# Patient Record
Sex: Female | Born: 1970
Health system: Southern US, Community
[De-identification: ages and names within clinical notes are randomized; demographics above are authoritative.]

## PROBLEM LIST (undated history)

## (undated) DIAGNOSIS — M25579 Pain in unspecified ankle and joints of unspecified foot: Secondary | ICD-10-CM

## (undated) DIAGNOSIS — F419 Anxiety disorder, unspecified: Secondary | ICD-10-CM

## (undated) DIAGNOSIS — G629 Polyneuropathy, unspecified: Secondary | ICD-10-CM

## (undated) HISTORY — DX: Polyneuropathy, unspecified: G62.9

## (undated) HISTORY — PX: ANKLE SURGERY: SHX546

## (undated) HISTORY — PX: FOOT SURGERY: SHX648

## (undated) HISTORY — DX: Pain in unspecified ankle and joints of unspecified foot: M25.579

## (undated) HISTORY — DX: Anxiety disorder, unspecified: F41.9

## (undated) HISTORY — PX: ABDOMINAL HYSTERECTOMY: SHX81

## (undated) HISTORY — PX: LEG SURGERY: SHX1003

## (undated) HISTORY — PX: TOTAL ABDOMINAL HYSTERECTOMY: SHX209

## (undated) HISTORY — PX: GALLBLADDER SURGERY: SHX652

---

## 2002-02-24 ENCOUNTER — Ambulatory Visit (HOSPITAL_COMMUNITY): Admission: RE | Admit: 2002-02-24 | Discharge: 2002-02-24 | Payer: Self-pay | Admitting: *Deleted

## 2002-02-25 ENCOUNTER — Encounter (HOSPITAL_COMMUNITY): Admission: RE | Admit: 2002-02-25 | Discharge: 2002-02-25 | Payer: Self-pay | Admitting: *Deleted

## 2002-05-12 ENCOUNTER — Inpatient Hospital Stay (HOSPITAL_COMMUNITY): Admission: RE | Admit: 2002-05-12 | Discharge: 2002-05-13 | Payer: Self-pay | Admitting: Gynecology

## 2011-04-24 DIAGNOSIS — M25579 Pain in unspecified ankle and joints of unspecified foot: Secondary | ICD-10-CM

## 2011-04-24 HISTORY — DX: Pain in unspecified ankle and joints of unspecified foot: M25.579

## 2013-09-11 ENCOUNTER — Ambulatory Visit: Payer: Self-pay | Admitting: Family Medicine

## 2013-09-11 DIAGNOSIS — Z0289 Encounter for other administrative examinations: Secondary | ICD-10-CM

## 2015-08-04 DIAGNOSIS — G629 Polyneuropathy, unspecified: Secondary | ICD-10-CM

## 2015-08-04 DIAGNOSIS — R110 Nausea: Secondary | ICD-10-CM

## 2015-08-04 DIAGNOSIS — F419 Anxiety disorder, unspecified: Secondary | ICD-10-CM

## 2015-08-04 HISTORY — DX: Anxiety disorder, unspecified: F41.9

## 2015-08-04 HISTORY — DX: Nausea: R11.0

## 2015-08-04 HISTORY — DX: Polyneuropathy, unspecified: G62.9

## 2015-12-02 ENCOUNTER — Encounter: Payer: Self-pay | Admitting: Osteopathic Medicine

## 2015-12-02 ENCOUNTER — Ambulatory Visit (INDEPENDENT_AMBULATORY_CARE_PROVIDER_SITE_OTHER): Payer: Self-pay | Admitting: Osteopathic Medicine

## 2015-12-02 VITALS — BP 135/83 | HR 98 | Temp 98.4°F | Ht 67.0 in | Wt 156.0 lb

## 2015-12-02 DIAGNOSIS — M791 Myalgia, unspecified site: Secondary | ICD-10-CM | POA: Insufficient documentation

## 2015-12-02 DIAGNOSIS — F1320 Sedative, hypnotic or anxiolytic dependence, uncomplicated: Secondary | ICD-10-CM | POA: Insufficient documentation

## 2015-12-02 DIAGNOSIS — Z79899 Other long term (current) drug therapy: Secondary | ICD-10-CM

## 2015-12-02 DIAGNOSIS — F411 Generalized anxiety disorder: Secondary | ICD-10-CM

## 2015-12-02 DIAGNOSIS — R110 Nausea: Secondary | ICD-10-CM

## 2015-12-02 HISTORY — DX: Generalized anxiety disorder: F41.1

## 2015-12-02 HISTORY — DX: Myalgia, unspecified site: M79.10

## 2015-12-02 HISTORY — DX: Sedative, hypnotic or anxiolytic dependence, uncomplicated: F13.20

## 2015-12-02 LAB — CBC WITH DIFFERENTIAL/PLATELET
BASOS PCT: 0 %
Basophils Absolute: 0 cells/uL (ref 0–200)
Eosinophils Absolute: 192 cells/uL (ref 15–500)
Eosinophils Relative: 3 %
HEMATOCRIT: 46.6 % — AB (ref 35.0–45.0)
HEMOGLOBIN: 15.8 g/dL — AB (ref 11.7–15.5)
LYMPHS ABS: 1408 {cells}/uL (ref 850–3900)
Lymphocytes Relative: 22 %
MCH: 34.3 pg — ABNORMAL HIGH (ref 27.0–33.0)
MCHC: 33.9 g/dL (ref 32.0–36.0)
MCV: 101.3 fL — ABNORMAL HIGH (ref 80.0–100.0)
MONO ABS: 640 {cells}/uL (ref 200–950)
MPV: 9.2 fL (ref 7.5–12.5)
Monocytes Relative: 10 %
Neutro Abs: 4160 cells/uL (ref 1500–7800)
Neutrophils Relative %: 65 %
Platelets: 270 10*3/uL (ref 140–400)
RBC: 4.6 MIL/uL (ref 3.80–5.10)
RDW: 12 % (ref 11.0–15.0)
WBC: 6.4 10*3/uL (ref 3.8–10.8)

## 2015-12-02 LAB — COMPLETE METABOLIC PANEL WITH GFR
ALBUMIN: 4.3 g/dL (ref 3.6–5.1)
ALK PHOS: 62 U/L (ref 33–115)
ALT: 56 U/L — AB (ref 6–29)
AST: 62 U/L — AB (ref 10–30)
BUN: 5 mg/dL — AB (ref 7–25)
CALCIUM: 9.3 mg/dL (ref 8.6–10.2)
CHLORIDE: 102 mmol/L (ref 98–110)
CO2: 26 mmol/L (ref 20–31)
CREATININE: 0.52 mg/dL (ref 0.50–1.10)
GFR, Est African American: >89 mL/min (ref >=60)
GFR, Est Non African American: >89 mL/min (ref >=60)
Glucose, Bld: 81 mg/dL (ref 65–99)
Potassium: 4.3 mmol/L (ref 3.5–5.3)
Sodium: 138 mmol/L (ref 135–146)
Total Bilirubin: 0.5 mg/dL (ref 0.2–1.2)
Total Protein: 7.4 g/dL (ref 6.1–8.1)

## 2015-12-02 LAB — LIPID PANEL
CHOLESTEROL: 183 mg/dL (ref 125–200)
HDL: 98 mg/dL (ref >=46)
LDL Cholesterol: 69 mg/dL (ref <130)
Total CHOL/HDL Ratio: 1.9 Ratio (ref <=5.0)
Triglycerides: 79 mg/dL (ref <150)
VLDL: 16 mg/dL (ref <30)

## 2015-12-02 LAB — TSH: TSH: 2.56 m[IU]/L

## 2015-12-02 MED ORDER — DIAZEPAM 5 MG PO TABS: ORAL_TABLET | ORAL | Status: DC

## 2015-12-02 MED ORDER — PROMETHAZINE HCL 25 MG PO TABS: 25.0000 mg | ORAL_TABLET | Freq: Two times a day (BID) | ORAL | Status: DC | PRN

## 2015-12-02 MED ORDER — AMITRIPTYLINE HCL 25 MG PO TABS: 25.0000 mg | ORAL_TABLET | Freq: Every day | ORAL | Status: DC

## 2015-12-02 MED ORDER — ESCITALOPRAM OXALATE 5 MG PO TABS: 5.0000 mg | ORAL_TABLET | Freq: Every day | ORAL | Status: DC

## 2015-12-02 MED ORDER — GABAPENTIN 300 MG PO CAPS: 300.0000 mg | ORAL_CAPSULE | Freq: Every day | ORAL | Status: DC

## 2015-12-02 NOTE — Patient Instructions (Signed)
PLAN TO FOLLOW UP HERE IN 2 WEEKS! SOONER IF NEEDED. WE ARE WORKING TO GET YOU SEEN BY PSYCHIATRY - YOU SHOULD HEAR ABOUT THIS REFERRAL SOON, PLEASE CALL us IF YOU [place] HEARD BACK BY NEXT Monday OR Tuesday.   ANY CONCERNS FOR WITHDRAWAL, ANY SEVERE ANXIETY OR DEPRESSIVE SYMPTOMS, PARTICULARLY THOUGHTS OF HURTING YOURSELF OR OTHERS, PLEASE SEEK CARE IN AN EMERGENCY ROOM RIGHT AWAY!

## 2015-12-02 NOTE — Progress Notes (Signed)
HPI: Deborah Zhang is a 45 y.o. female who presents to [place] today for chief complaint of:  Chief Complaint  Patient presents with  . Establish Care  . Medication Refill    clonazopam     BENZODIAZEPINE - Klonopin 0.5mg  qid, was previous Rx by Dr Murlean Iba. Has been taking it at this dose , was on 1 mg bid but then pharmacy a few years ago switched formularies and she had "reaction" to it - reaction was anxiety, no hives/anaphylaxis reaction. Previous medications include Hydroxyzine for a few years, prior to that in early 20s was on Xanax but she didn't like this. Was previously tried daily medications, not sure names of those meds, no adverse reaction to those medicines. History of hospitalization (stayed in ER) for withdrawal after quitting cold [place], this was at [facility]. She was back on qid dosing but has been trying to wean herself off and currently at tid/bid dosing.   ORTHOPEDICS - R leg and ankle, multiple surgeries, last seen by Ortho few years ago. Patient taking gabapentin and amitriptyline for chronic muscle spasm.  CHRONIC NAUSEA - Phenergan bid for several years, unknown previous workup.    Past medical, social and family history reviewed: No past medical history on file. Past Surgical History  Procedure Laterality Date  . Abdominal hysterectomy      for Cervical Cancer   . Gallbladder surgery    . Foot surgery    . Ankle surgery    . Leg surgery     Social History  Substance Use Topics  . Smoking status: Not on file  . Smokeless tobacco: Not on file  . Alcohol Use: Not on file   No family history on file.  No current outpatient prescriptions on file.   No current facility-administered medications for this visit.   No Known Allergies    Review of Systems: CONSTITUTIONAL:  No  fever, no chills, No recent illness, No unintentional weight changes HEAD/EYES/EARS/NOSE/THROAT: No  headache, no vision change, no hearing  change, No sore throat, No  sinus pressure CARDIAC: No  chest pain, No  pressure, No palpitations, No  orthopnea RESPIRATORY: No  cough, No  shortness of breath/wheeze GASTROINTESTINAL: No  nausea, No  vomiting, No  abdominal pain, No  blood in stool, No  diarrhea, No  constipation  MUSCULOSKELETAL: (+) myalgia/arthralgia GENITOURINARY: No  incontinence, No  abnormal genital bleeding/discharge SKIN: No  rash/wounds/concerning lesions HEM/ONC: No  easy bruising/bleeding, No  abnormal lymph node ENDOCRINE: No polyuria/polydipsia/polyphagia, No  heat/cold intolerance  NEUROLOGIC: No  weakness, No  dizziness, No  slurred speech PSYCHIATRIC: No  concerns with depression, (+) concerns with anxiety, No sleep problems  Exam:  BP 135/83 mmHg  Pulse 98  Temp(Src) 98.4 F (36.9 C)  Ht 5\' 7"  (1.702 m)  Wt 156 lb (70.761 kg)  BMI 24.43 kg/m2  SpO2 97%  LMP  Constitutional: VS see above. General Appearance: alert, well-developed, well-nourished, NAD Eyes: Normal lids and conjunctive, non-icteric sclera, PERRLA Ears, Nose, Mouth, Throat: MMM, Normal external inspection ears/nares/mouth/lips/gums, TM normal bilaterally. Pharynx/tonsils no erythema, no exudate. Nasal mucosa normal.  Neck: No masses, trachea midline. No thyroid enlargement. No tenderness/mass appreciated. No lymphadenopathy Respiratory: Normal respiratory effort. no wheeze, no rhonchi, no rales Cardiovascular: S1/S2 normal, no murmur, no rub/gallop auscultated. RRR.  No carotid bruit or JVD. No abdominal aortic bruit.  Pedal pulse II/IV bilaterally DP and PT.  No lower extremity edema. Gastrointestinal: Nontender, no masses. No hepatomegaly,  no splenomegaly. No hernia appreciated. Bowel sounds normal. Rectal exam deferred.  Musculoskeletal: Gait normal. No clubbing/cyanosis of digits.  Neurological: No cranial nerve deficit on limited exam. Motor and sensation intact and symmetric Skin: warm, dry, intact. No rash/ulcer. No concerning  nevi or subq nodules on limited exam.   Psychiatric: Normal judgment/insight. Normal mood and affect. Oriented x3.    No results found for this or any previous visit (from the past 72 hour(s)).  No results found.      ASSESSMENT/PLAN:   Benzo equivalents calculated. Clonazepam 0.5mg  = Valium 10 mg, went off tid dosing for taper = Valium equivalents at 30 mg/day so start taper at 50% = 15 mg/day and will slowly wean off reducing dose q 5 days.   Hopefully will be able to get her seen by psych before she is off the taper.   Can titrate up on Lexapro if tolerating low dose after 5 - 7 days - pt to call ad let me know how she is doing on this medicine.   Patient has been educated on significant possible side effects of medication and is instructed to contact me or other medical professional with any concerns about side effects.   Generalized anxiety disorder - Initiate exercise therapy while we taper down on benzodiazepines. Urgent referral to psychiatry.  - Plan: escitalopram (LEXAPRO) 5 MG tablet, Ambulatory referral to Psychiatry, TSH, diazepam (VALIUM) 5 MG tablet, DISCONTINUED: diazepam (VALIUM) 5 MG tablet  Benzodiazepine dependence ([place]) - Patient advised that do not prescribe long-term daily use of benzos for anxiety control, she is looking to get off of these medicines, taper initiated today  Chronic nausea - Plan: promethazine (PHENERGAN) 25 MG tablet  Muscle pain - Consider changing dose of gabapentin and could increase amitriptyline, we'll discuss this further at next visit - Plan: amitriptyline (ELAVIL) 25 MG tablet, gabapentin (NEURONTIN) 300 MG capsule  Medication management - Plan: CBC with Differential/Platelet, COMPLETE METABOLIC PANEL WITH GFR, Lipid panel, TSH  All questions were answered. Visit summary with medication list and pertinent instructions was printed for patient to review. ER/RTC precautions were reviewed with the patient. Return in about 2 weeks (around  12/16/2015) for FOLLOW-UP MEDICATION MANAGEMENT (OV30) .   Total time spent 45 minutes, greater than 50% of the visit was face-to-face counseling and coordinating care for diagnosis of anxiety and benzo dependence.

## 2015-12-05 ENCOUNTER — Telehealth: Payer: Self-pay

## 2015-12-05 DIAGNOSIS — F411 Generalized anxiety disorder: Secondary | ICD-10-CM

## 2015-12-05 MED ORDER — ESCITALOPRAM OXALATE 10 MG PO TABS: 10.0000 mg | ORAL_TABLET | Freq: Every day | ORAL | Status: DC

## 2015-12-05 NOTE — Telephone Encounter (Signed)
If she is not experiencing any side effects from the Lexapro, I would not expect it to make a huge difference in her mood after just the first week or so, but we can certainly go up on the dose to 10 mg. Can double up on the medicines she has and I have called in 10 mg pills to her pharmacy. She should also have received a call about setting up an appointment with psychiatry, please make sure that she has heard back about this.

## 2015-12-05 NOTE — Telephone Encounter (Signed)
Patient called stated that Lexapro 5 mg is not working for her. She request a higher dose. Please advise. Tonjia Parillo,CMA

## 2015-12-05 NOTE — Telephone Encounter (Signed)
Called patient to give her information but was unable to leave a message. Branson Kranz,CMA

## 2015-12-16 ENCOUNTER — Ambulatory Visit: Payer: Self-pay | Admitting: Osteopathic Medicine

## 2016-01-03 ENCOUNTER — Other Ambulatory Visit: Payer: Self-pay | Admitting: Osteopathic Medicine

## 2016-05-30 ENCOUNTER — Other Ambulatory Visit: Payer: Self-pay | Admitting: Osteopathic Medicine

## 2016-05-30 DIAGNOSIS — R110 Nausea: Secondary | ICD-10-CM

## 2016-06-21 DIAGNOSIS — K921 Melena: Secondary | ICD-10-CM | POA: Diagnosis not present

## 2016-06-21 DIAGNOSIS — F17210 Nicotine dependence, cigarettes, uncomplicated: Secondary | ICD-10-CM | POA: Diagnosis not present

## 2016-06-21 DIAGNOSIS — Z7982 Long term (current) use of aspirin: Secondary | ICD-10-CM | POA: Diagnosis not present

## 2016-06-21 DIAGNOSIS — R1032 Left lower quadrant pain: Secondary | ICD-10-CM | POA: Diagnosis not present

## 2016-06-21 DIAGNOSIS — G629 Polyneuropathy, unspecified: Secondary | ICD-10-CM | POA: Diagnosis not present

## 2016-06-21 DIAGNOSIS — R110 Nausea: Secondary | ICD-10-CM | POA: Diagnosis not present

## 2016-06-21 DIAGNOSIS — Z79899 Other long term (current) drug therapy: Secondary | ICD-10-CM | POA: Diagnosis not present

## 2016-06-21 DIAGNOSIS — Z720 Tobacco use: Secondary | ICD-10-CM | POA: Diagnosis not present

## 2016-06-21 DIAGNOSIS — R1030 Lower abdominal pain, unspecified: Secondary | ICD-10-CM | POA: Diagnosis not present

## 2016-06-21 DIAGNOSIS — F419 Anxiety disorder, unspecified: Secondary | ICD-10-CM | POA: Diagnosis not present

## 2016-06-26 DIAGNOSIS — K921 Melena: Secondary | ICD-10-CM | POA: Diagnosis not present

## 2016-06-27 ENCOUNTER — Other Ambulatory Visit: Payer: Self-pay | Admitting: Osteopathic Medicine

## 2016-06-27 DIAGNOSIS — R110 Nausea: Secondary | ICD-10-CM

## 2016-06-28 ENCOUNTER — Other Ambulatory Visit: Payer: Self-pay | Admitting: *Deleted

## 2016-06-28 DIAGNOSIS — R110 Nausea: Secondary | ICD-10-CM

## 2016-06-28 MED ORDER — PROMETHAZINE HCL 25 MG PO TABS: 25.0000 mg | ORAL_TABLET | Freq: Two times a day (BID) | ORAL | 0 refills | Status: DC | PRN

## 2016-06-29 ENCOUNTER — Other Ambulatory Visit: Payer: Self-pay | Admitting: Osteopathic Medicine

## 2016-06-29 DIAGNOSIS — M791 Myalgia, unspecified site: Secondary | ICD-10-CM

## 2016-06-29 DIAGNOSIS — R197 Diarrhea, unspecified: Secondary | ICD-10-CM | POA: Diagnosis not present

## 2016-06-29 DIAGNOSIS — K921 Melena: Secondary | ICD-10-CM | POA: Diagnosis not present

## 2016-06-29 DIAGNOSIS — K648 Other hemorrhoids: Secondary | ICD-10-CM | POA: Diagnosis not present

## 2016-06-29 DIAGNOSIS — D123 Benign neoplasm of transverse colon: Secondary | ICD-10-CM | POA: Diagnosis not present

## 2016-07-08 ENCOUNTER — Encounter: Payer: Self-pay | Admitting: Osteopathic Medicine

## 2016-07-08 DIAGNOSIS — Z860100 Personal history of colon polyps, unspecified: Secondary | ICD-10-CM

## 2016-07-08 HISTORY — DX: Personal history of colon polyps, unspecified: Z86.0100

## 2016-07-08 HISTORY — DX: Personal history of colonic polyps: Z86.010

## 2016-07-18 ENCOUNTER — Ambulatory Visit (INDEPENDENT_AMBULATORY_CARE_PROVIDER_SITE_OTHER): Payer: BLUE CROSS/BLUE SHIELD | Admitting: Osteopathic Medicine

## 2016-07-18 ENCOUNTER — Encounter: Payer: Self-pay | Admitting: Osteopathic Medicine

## 2016-07-18 VITALS — BP 127/86 | HR 92 | Ht 67.0 in | Wt 148.0 lb

## 2016-07-18 DIAGNOSIS — M791 Myalgia, unspecified site: Secondary | ICD-10-CM

## 2016-07-18 DIAGNOSIS — R030 Elevated blood-pressure reading, without diagnosis of hypertension: Secondary | ICD-10-CM | POA: Diagnosis not present

## 2016-07-18 DIAGNOSIS — Z23 Encounter for immunization: Secondary | ICD-10-CM

## 2016-07-18 DIAGNOSIS — R110 Nausea: Secondary | ICD-10-CM

## 2016-07-18 DIAGNOSIS — F411 Generalized anxiety disorder: Secondary | ICD-10-CM

## 2016-07-18 DIAGNOSIS — G629 Polyneuropathy, unspecified: Secondary | ICD-10-CM

## 2016-07-18 MED ORDER — PROMETHAZINE HCL 25 MG PO TABS: 25.0000 mg | ORAL_TABLET | Freq: Two times a day (BID) | ORAL | 3 refills | Status: DC | PRN

## 2016-07-18 MED ORDER — AMITRIPTYLINE HCL 25 MG PO TABS: 25.0000 mg | ORAL_TABLET | Freq: Every day | ORAL | 3 refills | Status: DC

## 2016-07-18 MED ORDER — GABAPENTIN 300 MG PO CAPS: 300.0000 mg | ORAL_CAPSULE | ORAL | 11 refills | Status: DC

## 2016-07-18 NOTE — Progress Notes (Signed)
HPI: Deborah Zhang is a 46 y.o. female  who presents to [name] today, 07/18/16,  for chief complaint of:  Chief Complaint  Patient presents with  . Follow-up    BLOOD PRESSURE    Anxiety: last saw patient several months ago. At that point, benzodiazepine dependence which we were trying to bring her off of with a slow taper of Valium. I did not seen her since but this regimen was successful for her. Overall, her anxiety is well controlled on low-dose amitriptyline  Chronic pain: Taking gabapentin multiple doses per day. Amitriptyline still at low dose.  Chronic nausea: Stable on current medications  Patient is accompanied by daughter who assists with history-taking.   Past medical, surgical, social and family history reviewed: Patient Active Problem List   Diagnosis Date Noted  . History of colonic polyps 07/08/2016  . Benzodiazepine dependence ([place]) 12/02/2015  . Generalized anxiety disorder 12/02/2015  . Muscle pain 12/02/2015  . Anxiety 08/04/2015  . Chronic nausea 08/04/2015  . Neuropathy ([place]) 08/04/2015  . Ankle pain 04/24/2011   Past Surgical History:  Procedure Laterality Date  . ABDOMINAL HYSTERECTOMY     for Cervical Cancer   . ANKLE SURGERY    . FOOT SURGERY    . GALLBLADDER SURGERY    . LEG SURGERY     Social History  Substance Use Topics  . Smoking status: Current Every Day Smoker    Packs/day: 1.00    Years: 20.00    Types: Cigarettes  . Smokeless tobacco: Never Used  . Alcohol use 0.6 oz/week    1 Standard drinks or equivalent per week   Family History  Problem Relation Age of Onset  . Depression Father   . Depression Mother   . Hyperlipidemia Mother   . Hypertension Mother      Current medication list and allergy/intolerance information reviewed:   Current Outpatient Prescriptions  Medication Sig Dispense Refill  . amitriptyline (ELAVIL) 25 MG tablet Take 1 tablet (25 mg total) by mouth at bedtime.  NEED FOLLOW UP APPOINTMENT FOR MORE REFILLS 30 tablet 0  . gabapentin (NEURONTIN) 300 MG capsule Take 1 capsule (300 mg total) by mouth daily. Take one capsule by mouth up to 5 times daily. Do not exceed 5 capsules daily 150 capsule 6  . promethazine (PHENERGAN) 25 MG tablet Take 1 tablet (25 mg total) by mouth 2 (two) times daily as needed for nausea. 60 tablet 0   No current facility-administered medications for this visit.    No Known Allergies    Review of Systems:  Constitutional: No recent illness,  Cardiac: No  chest pain, No  pressure  Respiratory:  No  shortness of breath. No  Cough  Gastrointestinal: No  abdominal pain, +nausea, No  vomiting,  No  blood in stool, No  diarrhea, No  constipation   Musculoskeletal: No new myalgia/arthralgia  Skin: No  Rash,  Psychiatric: No  concerns with depression, No  concerns with anxiety, No sleep problems, No mood problems  Exam:  BP (!) 133/94   Pulse 98   Ht 5\' 7"  (1.702 m)   Wt 148 lb (67.1 kg)   BMI 23.18 kg/m   Constitutional: VS see above. General Appearance: alert, well-developed, well-nourished, NAD  Eyes: Normal lids and conjunctive, non-icteric sclera  Ears, Nose, Mouth, Throat: MMM,  Neck: No masses, trachea midline.   Respiratory: Normal respiratory effort. no wheeze, no rhonchi, no rales  Cardiovascular: S1/S2 normal, no murmur,  no rub/gallop auscultated. RRR. No lower extremity edema.   Neurological: Normal balance/coordination. No tremor.   Psychiatric: Normal judgment/insight. Normal mood and affect. Oriented x3.    GAD 7 : Generalized Anxiety Score 07/18/2016  Nervous, Anxious, on Edge 0  Control/stop worrying 0  Worry too much - different things 0  Trouble relaxing 0  Restless 0  Easily annoyed or irritable 0  Afraid - awful might happen 0  Total GAD 7 Score 0  Anxiety Difficulty Not difficult at all    Depression screen [facility] 2/9 07/18/2016  Decreased Interest 0  Down, Depressed, Hopeless 0   PHQ - 2 Score 0  Altered sleeping 0  Tired, decreased energy 0  Change in appetite 0  Feeling bad or failure about yourself  0  Trouble concentrating 0  Moving slowly or fidgety/restless 0  Suicidal thoughts 0  PHQ-9 Score 0     ASSESSMENT/PLAN:   Generalized anxiety disorder  Muscle pain - Patient was to stay on current dose of gabapentin/amitriptyline. Continue for now. - Plan: amitriptyline (ELAVIL) 25 MG tablet, gabapentin (NEURONTIN) 300 MG capsule  Chronic nausea - Refill promethazine. - Plan: promethazine (PHENERGAN) 25 MG tablet  Elevated blood pressure reading  Need for immunization against influenza - Plan: Flu Vaccine QUAD 36+ mos IM      Visit summary with medication list and pertinent instructions was printed for patient to review. All questions at time of visit were answered - patient instructed to contact office with any additional concerns. ER/RTC precautions were reviewed with the patient. Follow-up plan: Return in about 6 months (around 01/15/2017) for [place] or sooner if needed.

## 2017-06-21 ENCOUNTER — Other Ambulatory Visit: Payer: Self-pay | Admitting: Osteopathic Medicine

## 2017-06-21 DIAGNOSIS — R110 Nausea: Secondary | ICD-10-CM

## 2017-07-22 ENCOUNTER — Other Ambulatory Visit: Payer: Self-pay | Admitting: Osteopathic Medicine

## 2017-07-22 DIAGNOSIS — R110 Nausea: Secondary | ICD-10-CM

## 2017-07-22 DIAGNOSIS — M791 Myalgia, unspecified site: Secondary | ICD-10-CM

## 2017-07-22 DIAGNOSIS — G629 Polyneuropathy, unspecified: Secondary | ICD-10-CM

## 2017-07-24 ENCOUNTER — Encounter: Payer: Self-pay | Admitting: Osteopathic Medicine

## 2017-07-24 ENCOUNTER — Ambulatory Visit: Payer: BLUE CROSS/BLUE SHIELD | Admitting: Osteopathic Medicine

## 2017-07-24 VITALS — BP 145/85 | HR 101 | Temp 98.0°F | Wt 148.0 lb

## 2017-07-24 DIAGNOSIS — R110 Nausea: Secondary | ICD-10-CM

## 2017-07-24 DIAGNOSIS — J029 Acute pharyngitis, unspecified: Secondary | ICD-10-CM

## 2017-07-24 DIAGNOSIS — M791 Myalgia, unspecified site: Secondary | ICD-10-CM

## 2017-07-24 DIAGNOSIS — R69 Illness, unspecified: Secondary | ICD-10-CM | POA: Diagnosis not present

## 2017-07-24 DIAGNOSIS — R0981 Nasal congestion: Secondary | ICD-10-CM

## 2017-07-24 DIAGNOSIS — J111 Influenza due to unidentified influenza virus with other respiratory manifestations: Secondary | ICD-10-CM

## 2017-07-24 DIAGNOSIS — G629 Polyneuropathy, unspecified: Secondary | ICD-10-CM

## 2017-07-24 DIAGNOSIS — R05 Cough: Secondary | ICD-10-CM

## 2017-07-24 DIAGNOSIS — R059 Cough, unspecified: Secondary | ICD-10-CM

## 2017-07-24 LAB — POCT INFLUENZA A/B
Influenza A, POC: NEGATIVE
Influenza B, POC: NEGATIVE

## 2017-07-24 LAB — POCT RAPID STREP A (OFFICE): Rapid Strep A Screen: NEGATIVE

## 2017-07-24 MED ORDER — PROMETHAZINE HCL 25 MG PO TABS: 25.0000 mg | ORAL_TABLET | Freq: Two times a day (BID) | ORAL | 3 refills | Status: DC | PRN

## 2017-07-24 MED ORDER — GABAPENTIN 300 MG PO CAPS: ORAL_CAPSULE | ORAL | 11 refills | Status: DC

## 2017-07-24 MED ORDER — LIDOCAINE VISCOUS HCL 2 % MT SOLN
5.0000 mL | OROMUCOSAL | 1 refills | Status: DC | PRN
Start: 2017-07-24 — End: 2018-10-08

## 2017-07-24 MED ORDER — IPRATROPIUM BROMIDE 0.06 % NA SOLN: 2.0000 | Freq: Four times a day (QID) | NASAL | 1 refills | Status: DC

## 2017-07-24 MED ORDER — AMITRIPTYLINE HCL 25 MG PO TABS: 25.0000 mg | ORAL_TABLET | Freq: Every day | ORAL | 3 refills | Status: DC

## 2017-07-24 MED ORDER — PREDNISONE 20 MG PO TABS: 20.0000 mg | ORAL_TABLET | Freq: Two times a day (BID) | ORAL | 0 refills | Status: DC

## 2017-07-24 MED ORDER — BENZONATATE 200 MG PO CAPS: 200.0000 mg | ORAL_CAPSULE | Freq: Three times a day (TID) | ORAL | 0 refills | Status: DC | PRN

## 2017-07-24 NOTE — Patient Instructions (Addendum)
Viral Respiratory Infection A respiratory infection is an illness that affects part of the respiratory system, such as the lungs, nose, or throat. Most respiratory infections are caused by either viruses or bacteria. A respiratory infection that is caused by a virus is called a viral respiratory infection. Common types of viral respiratory infections include:  A cold.  The flu (influenza).  A respiratory syncytial virus (RSV) infection.  How do I know if I have a viral respiratory infection? Most viral respiratory infections cause:  A stuffy or runny nose.  Yellow or green nasal discharge.  A cough.  Sneezing.  Fatigue.  Achy muscles.  A sore throat.  Sweating or chills.  A fever.  A headache.  How are viral respiratory infections treated? If influenza is diagnosed early, it may be treated with an antiviral medicine that shortens the length of time a person has symptoms. Symptoms of viral respiratory infections may be treated with over-the-counter and prescription medicines, such as:  Expectorants. These make it easier to cough up mucus.  Decongestant nasal sprays.  Health care providers do not prescribe antibiotic medicines for viral infections. This is because antibiotics are designed to kill bacteria. They have no effect on viruses.  How do I know if I should stay home from work or school? To avoid exposing others to your respiratory infection, stay home if you have:  A fever.  A persistent cough.  A sore throat.  A runny nose.  Sneezing.  Muscles aches.  Headaches.  Fatigue.  Weakness.  Chills.  Sweating.  Nausea.  Follow these instructions at home:  Rest as much as possible.  Take over-the-counter and prescription medicines only as told by your health care provider.  Drink enough fluid to keep your urine clear or pale yellow. This helps prevent dehydration and helps loosen up mucus.  Gargle with a salt-water mixture 3-4 times per day  or as needed. To make a salt-water mixture, completely dissolve -1 tsp of salt in 1 cup of warm water.  Use nose drops made from salt water to ease congestion and soften raw skin around your nose.  Do not drink alcohol.  Do not use tobacco products, including cigarettes, chewing tobacco, and e-cigarettes. If you need help quitting, ask your health care provider. Contact a health care provider if:  Your symptoms last for 10 days or longer.  Your symptoms get worse over time.  You have a fever.  You have severe sinus pain in your face or forehead.  The glands in your jaw or neck become very swollen. Get help right away if:  You feel pain or pressure in your chest.  You have shortness of breath.  You faint or feel like you will faint.  You have severe and persistent vomiting.  You feel confused or disoriented. This information is not intended to replace advice given to you by your health care provider. Make sure you discuss any questions you have with your health care provider. Document Released: 03/14/2005 Document Revised: 11/10/2015 Document Reviewed: 11/10/2014 Elsevier Interactive Patient Education  2018 [place] for Upper Respiratory Illness  Note: the following list assumes no pregnancy, normal liver & kidney function and no other drug interactions. Dr. Sheppard Coil has highlighted medications which are safe for you to use, but these may not be appropriate for everyone. Always ask a pharmacist or qualified medical provider if you have any questions!   Aches/Pains, Fever, Headache Acetaminophen (Tylenol) 500 mg tablets -  take max 2 tablets (1000 mg) every 6 hours (4 times per day)  Ibuprofen (Motrin) 200 mg tablets - take max 4 tablets (800 mg) every 6 hours*  Sinus Congestion Prescription Atrovent as directed Nasal Saline if desired to rinse  Phenylephrine (Sudafed) 10 mg tablets every 4 hours (or the 12-hour  formulation)* Diphenhydramine (Benadryl) 25 mg tablets - take max 2 tablets every 4 hours  Cough & Sore Throat Prescription cough pills as directed Dextromethorphan (Robitussin, others) - cough suppressant Guaifenesin (Robitussin, Mucinex, others) - expectorant (helps cough up mucus) (Dextromethorphan and Guaifenesin also come in a combination tablet) Lozenges w/ Benzocaine + Menthol (Cepacol) Lidocaine - numbing medicine for the throat Honey - as much as you want! Teas which "coat the throat" - look for ingredients Elm Bark, Licorice Root, Marshmallow Root  *Caution in patients with high blood pressure

## 2017-07-24 NOTE — Progress Notes (Addendum)
HPI: Deborah Zhang is a 47 y.o. female who  has a past medical history of Ankle pain (04/24/2011), Anxiety (08/04/2015), and Neuropathy ([place]) (08/04/2015).  she presents to [facility] today, 07/24/17,  for chief complaint of: Illness - concern for flu  5 days cough, sore throat, sinus pressure. Gatorade and Nyquil tried. No flu shot this season. Subjective fever, chills. Nausea and loose stool as well.     Past medical, surgical, social and family history reviewed:  Patient Active Problem List   Diagnosis Date Noted  . History of colonic polyps 07/08/2016  . Benzodiazepine dependence ([place]) 12/02/2015  . Generalized anxiety disorder 12/02/2015  . Muscle pain 12/02/2015  . Anxiety 08/04/2015  . Chronic nausea 08/04/2015  . Neuropathy 08/04/2015  . Ankle pain 04/24/2011    Past Surgical History:  Procedure Laterality Date  . ABDOMINAL HYSTERECTOMY     for Cervical Cancer   . ANKLE SURGERY    . FOOT SURGERY    . GALLBLADDER SURGERY    . LEG SURGERY      Social History   Tobacco Use  . Smoking status: Current Every Day Smoker    Packs/day: 1.00    Years: 20.00    Pack years: 20.00    Types: Cigarettes  . Smokeless tobacco: Never Used  Substance Use Topics  . Alcohol use: Yes    Alcohol/week: 0.6 oz    Types: 1 Standard drinks or equivalent per week    Family History  Problem Relation Age of Onset  . Depression Father   . Depression Mother   . Hyperlipidemia Mother   . Hypertension Mother      Current medication list and allergy/intolerance information reviewed:    Current Outpatient Medications  Medication Sig Dispense Refill  . amitriptyline (ELAVIL) 25 MG tablet Take 1 tablet (25 mg total) by mouth at bedtime. 90 tablet 3  . gabapentin (NEURONTIN) 300 MG capsule Take one capsule by mouth up to 5 times daily. Do not exceed 5 capsules daily 150 capsule 0  . promethazine (PHENERGAN) 25 MG tablet Take 1 tablet (25 mg total)  by mouth 2 (two) times daily as needed for nausea. 60 tablet 0   No current facility-administered medications for this visit.     No Known Allergies    Review of Systems:  Constitutional:  +subjective fever, +chills, +recent illness, No unintentional weight changes. +significant fatigue.   HEENT: +headache, no vision change, no hearing change, +sore throat, +sinus pressure  Cardiac: No  chest pain, No  pressure, No palpitations  Respiratory:  No  shortness of breath. +Cough  Gastrointestinal: No  abdominal pain, +nausea, No  vomiting,  No  blood in stool, +diarrhea  Musculoskeletal: +genrealized myalgia/arthralgia  Skin: No  Rash  Neurologic: +genrealized weakness, No  dizziness  Exam:  BP (!) 145/85   Pulse (!) 101   Temp 98 F (36.7 C) (Oral)   Wt 148 lb 0.6 oz (67.2 kg)   SpO2 99%   BMI 23.19 kg/m   Constitutional: VS see above. General Appearance: alert, well-developed, well-nourished, NAD  Eyes: Normal lids and conjunctive, non-icteric sclera  Ears, Nose, Mouth, Throat: MMM, Normal external inspection ears/nares/mouth/lips/gums. TM normal bilaterally. Pharynx/tonsils +erythema, no exudate. Nasal mucosa normal.   Neck: No masses, trachea midline. No tenderness/mass appreciated. No lymphadenopathy  Respiratory: Normal respiratory effort. no wheeze, no rhonchi, no rales  Cardiovascular: S1/S2 normal, no murmur, no rub/gallop auscultated. RRR. No lower extremity edema.   Gastrointestinal:  Nontender, no masses. Bowel sounds normal.  Musculoskeletal: Gait normal.   Neurological: Normal balance/coordination. No tremor.   Psychiatric: Normal judgment/insight. Normal mood and affect.   Results for orders placed or performed in visit on 07/24/17 (from the past 24 hour(s))  POCT rapid strep A     Status: None   Collection Time: 07/24/17  2:33 PM  Result Value Ref Range   Rapid Strep A Screen Negative Negative  POCT Influenza A/B     Status: None   Collection  Time: 07/24/17  2:34 PM  Result Value Ref Range   Influenza A, POC Negative Negative   Influenza B, POC Negative Negative     ASSESSMENT/PLAN:   Influenza-like illness - Plan: POCT Influenza A/B, predniSONE (DELTASONE) 20 MG tablet  Cough - Plan: benzonatate (TESSALON) 200 MG capsule  Sore throat - Plan: POCT rapid strep A, Lidocaine HCl 2 % SOLN  Sinus congestion - Plan: ipratropium (ATROVENT) 0.06 % nasal spray    Patient Instructions  Viral Respiratory Infection A respiratory infection is an illness that affects part of the respiratory system, such as the lungs, nose, or throat. Most respiratory infections are caused by either viruses or bacteria. A respiratory infection that is caused by a virus is called a viral respiratory infection. Common types of viral respiratory infections include:  A cold.  The flu (influenza).  A respiratory syncytial virus (RSV) infection.  How do I know if I have a viral respiratory infection? Most viral respiratory infections cause:  A stuffy or runny nose.  Yellow or green nasal discharge.  A cough.  Sneezing.  Fatigue.  Achy muscles.  A sore throat.  Sweating or chills.  A fever.  A headache.  How are viral respiratory infections treated? If influenza is diagnosed early, it may be treated with an antiviral medicine that shortens the length of time a person has symptoms. Symptoms of viral respiratory infections may be treated with over-the-counter and prescription medicines, such as:  Expectorants. These make it easier to cough up mucus.  Decongestant nasal sprays.  Health care providers do not prescribe antibiotic medicines for viral infections. This is because antibiotics are designed to kill bacteria. They have no effect on viruses.  How do I know if I should stay home from work or school? To avoid exposing others to your respiratory infection, stay home if you have:  A fever.  A persistent cough.  A sore  throat.  A runny nose.  Sneezing.  Muscles aches.  Headaches.  Fatigue.  Weakness.  Chills.  Sweating.  Nausea.  Follow these instructions at home:  Rest as much as possible.  Take over-the-counter and prescription medicines only as told by your health care provider.  Drink enough fluid to keep your urine clear or pale yellow. This helps prevent dehydration and helps loosen up mucus.  Gargle with a salt-water mixture 3-4 times per day or as needed. To make a salt-water mixture, completely dissolve -1 tsp of salt in 1 cup of warm water.  Use nose drops made from salt water to ease congestion and soften raw skin around your nose.  Do not drink alcohol.  Do not use tobacco products, including cigarettes, chewing tobacco, and e-cigarettes. If you need help quitting, ask your health care provider. Contact a health care provider if:  Your symptoms last for 10 days or longer.  Your symptoms get worse over time.  You have a fever.  You have severe sinus pain in your face or forehead.  The glands in your jaw or neck become very swollen. Get help right away if:  You feel pain or pressure in your chest.  You have shortness of breath.  You faint or feel like you will faint.  You have severe and persistent vomiting.  You feel confused or disoriented. This information is not intended to replace advice given to you by your health care provider. Make sure you discuss any questions you have with your health care provider. Document Released: 03/14/2005 Document Revised: 11/10/2015 Document Reviewed: 11/10/2014 Elsevier Interactive Patient Education  2018 [place] for Upper Respiratory Illness  Note: the following list assumes no pregnancy, normal liver & kidney function and no other drug interactions. Dr. Sheppard Coil has highlighted medications which are safe for you to use, but these may not be appropriate for  everyone. Always ask a pharmacist or qualified medical provider if you have any questions!   Aches/Pains, Fever, Headache Acetaminophen (Tylenol) 500 mg tablets - take max 2 tablets (1000 mg) every 6 hours (4 times per day)  Ibuprofen (Motrin) 200 mg tablets - take max 4 tablets (800 mg) every 6 hours*  Sinus Congestion Prescription Atrovent as directed Nasal Saline if desired to rinse  Phenylephrine (Sudafed) 10 mg tablets every 4 hours (or the 12-hour formulation)* Diphenhydramine (Benadryl) 25 mg tablets - take max 2 tablets every 4 hours  Cough & Sore Throat Prescription cough pills as directed Dextromethorphan (Robitussin, others) - cough suppressant Guaifenesin (Robitussin, Mucinex, others) - expectorant (helps cough up mucus) (Dextromethorphan and Guaifenesin also come in a combination tablet) Lozenges w/ Benzocaine + Menthol (Cepacol) Lidocaine - numbing medicine for the throat Honey - as much as you want! Teas which "coat the throat" - look for ingredients Elm Bark, Licorice Root, Marshmallow Root  *Caution in patients with high blood pressure       Visit summary with medication list and pertinent instructions was printed for patient to review. All questions at time of visit were answered - patient instructed to contact office with any additional concerns. ER/RTC precautions were reviewed with the patient.   Follow-up plan: Return if symptoms worsen or fail to improve, call us f no better by Monday, to urgent care or ER if worse.  Note: Total time spent 25 minutes, greater than 50% of the visit was spent face-to-face counseling and coordinating care for the following: The primary encounter diagnosis was Influenza-like illness. Diagnoses of Cough, Sore throat, Sinus congestion, Muscle pain, and Neuropathy were also pertinent to this visit.Marland Kitchen  Please note: voice recognition software was used to produce this document, and typos may escape review. Please contact Dr. Sheppard Coil  for any needed clarifications.

## 2018-07-17 ENCOUNTER — Other Ambulatory Visit: Payer: Self-pay | Admitting: Osteopathic Medicine

## 2018-07-17 DIAGNOSIS — M791 Myalgia, unspecified site: Secondary | ICD-10-CM

## 2018-07-17 DIAGNOSIS — R110 Nausea: Secondary | ICD-10-CM

## 2018-07-17 DIAGNOSIS — G629 Polyneuropathy, unspecified: Secondary | ICD-10-CM

## 2018-08-14 ENCOUNTER — Other Ambulatory Visit: Payer: Self-pay | Admitting: Osteopathic Medicine

## 2018-08-14 DIAGNOSIS — M791 Myalgia, unspecified site: Secondary | ICD-10-CM

## 2018-08-14 DIAGNOSIS — G629 Polyneuropathy, unspecified: Secondary | ICD-10-CM

## 2018-09-12 ENCOUNTER — Other Ambulatory Visit: Payer: Self-pay | Admitting: Osteopathic Medicine

## 2018-09-12 DIAGNOSIS — G629 Polyneuropathy, unspecified: Secondary | ICD-10-CM

## 2018-09-12 DIAGNOSIS — M791 Myalgia, unspecified site: Secondary | ICD-10-CM

## 2018-10-06 ENCOUNTER — Other Ambulatory Visit: Payer: Self-pay | Admitting: Osteopathic Medicine

## 2018-10-06 DIAGNOSIS — M791 Myalgia, unspecified site: Secondary | ICD-10-CM

## 2018-10-06 DIAGNOSIS — G629 Polyneuropathy, unspecified: Secondary | ICD-10-CM

## 2018-10-07 ENCOUNTER — Other Ambulatory Visit: Payer: Self-pay | Admitting: Osteopathic Medicine

## 2018-10-07 DIAGNOSIS — R110 Nausea: Secondary | ICD-10-CM

## 2018-10-08 ENCOUNTER — Encounter: Payer: Self-pay | Admitting: Osteopathic Medicine

## 2018-10-08 ENCOUNTER — Ambulatory Visit (INDEPENDENT_AMBULATORY_CARE_PROVIDER_SITE_OTHER): Payer: Self-pay | Admitting: Osteopathic Medicine

## 2018-10-08 VITALS — BP 159/100 | HR 109 | Temp 98.6°F | Wt 148.0 lb

## 2018-10-08 DIAGNOSIS — G629 Polyneuropathy, unspecified: Secondary | ICD-10-CM

## 2018-10-08 DIAGNOSIS — R002 Palpitations: Secondary | ICD-10-CM

## 2018-10-08 DIAGNOSIS — M791 Myalgia, unspecified site: Secondary | ICD-10-CM

## 2018-10-08 DIAGNOSIS — R110 Nausea: Secondary | ICD-10-CM

## 2018-10-08 DIAGNOSIS — F419 Anxiety disorder, unspecified: Secondary | ICD-10-CM

## 2018-10-08 MED ORDER — ESCITALOPRAM OXALATE 5 MG PO TABS: 5.0000 mg | ORAL_TABLET | Freq: Every day | ORAL | 1 refills | Status: DC

## 2018-10-08 MED ORDER — GABAPENTIN 300 MG PO CAPS: 300.0000 mg | ORAL_CAPSULE | Freq: Three times a day (TID) | ORAL | 1 refills | Status: DC | PRN

## 2018-10-08 MED ORDER — PROMETHAZINE HCL 25 MG PO TABS: 25.0000 mg | ORAL_TABLET | Freq: Three times a day (TID) | ORAL | 0 refills | Status: DC | PRN

## 2018-10-08 MED ORDER — PROPRANOLOL HCL ER 60 MG PO CP24: 60.0000 mg | ORAL_CAPSULE | Freq: Every day | ORAL | 1 refills | Status: DC

## 2018-10-08 NOTE — Progress Notes (Signed)
Virtual Visit  via Video or Phone Note  I connected with      Deborah Zhang on 10/08/18 at 1:20 by a telemedicine application and verified that I am speaking with the correct person using two identifiers.   I discussed the limitations of evaluation and management by telemedicine and the availability of in person appointments. The patient expressed understanding and agreed to proceed.  History of Present Illness: Deborah Zhang is a 48 y.o. female who would like to discuss  Chief Complaint  Patient presents with  . Medication Refill  . Panic Attack     Reports anxiety, heart racing. Feels confident it's more related to anxiety rather than anything else. Has had to quit driving due to panic issues. Was previously doing ok on the amitriptyline, which was initially started by her surgeon for ankle surgery, "wasn't on this for anxiety."    Other medications:  Klonopin daily previously, we weaned off this medicine  Prozac, Zoloft in the past  Xanax in the past     Observations/Objective: BP (!) 159/100 (BP Location: Left Arm, Patient Position: Sitting, Cuff Size: Normal)   Pulse (!) 109   Temp 98.6 F (37 C) (Oral)   Wt 148 lb (67.1 kg)   BMI 23.18 kg/m  BP Readings from Last 3 Encounters:  10/08/18 (!) 159/100  07/24/17 (!) 145/85  07/18/16 127/86    Exam: Normal Speech.   GAD 7 : Generalized Anxiety Score 10/08/2018 07/18/2016  Nervous, Anxious, on Edge 2 0  Control/stop worrying 2 0  Worry too much - different things 2 0  Trouble relaxing 3 0  Restless 1 0  Easily annoyed or irritable 1 0  Afraid - awful might happen 1 0  Total GAD 7 Score 12 0  Anxiety Difficulty Not difficult at all Not difficult at all    Depression screen [facility] 2/9 10/08/2018 07/18/2016  Decreased Interest 1 0  Down, Depressed, Hopeless 0 0  PHQ - 2 Score 1 0  Altered sleeping - 0  Tired, decreased energy - 0  Change in appetite - 0  Feeling bad or failure about yourself  - 0  Trouble  concentrating - 0  Moving slowly or fidgety/restless - 0  Suicidal thoughts - 0  PHQ-9 Score - 0       Lab and Radiology Results No results found for this or any previous visit (from the past 72 hour(s)). No results found.     Assessment and Plan: 48 y.o. female with The primary encounter diagnosis was Anxiety. Diagnoses of Muscle pain, Neuropathy, Chronic nausea, and Palpitation were also pertinent to this visit.  Will trial low dose lexapro plus propranolol for anxiety/palpitatins ,pt was asked to get labs done within the next week for medication safety purposes. Is self pay.   PDMP not reviewed this encounter. Orders Placed This Encounter  Procedures  . TSH  . COMPLETE METABOLIC PANEL WITH GFR  . CBC   Meds ordered this encounter  Medications  . gabapentin (NEURONTIN) 300 MG capsule    Sig: Take 1 capsule (300 mg total) by mouth 3 (three) times daily as needed.    Dispense:  90 capsule    Refill:  1  . promethazine (PHENERGAN) 25 MG tablet    Sig: Take 1 tablet (25 mg total) by mouth every 8 (eight) hours as needed for nausea or vomiting.    Dispense:  60 tablet    Refill:  0    No  refills, last visit was over a yr ago. Pt is overdue for f/u visit w/Provider.  . propranolol ER (INDERAL LA) 60 MG 24 hr capsule    Sig: Take 1 capsule (60 mg total) by mouth daily.    Dispense:  30 capsule    Refill:  1  . escitalopram (LEXAPRO) 5 MG tablet    Sig: Take 1 tablet (5 mg total) by mouth at bedtime.    Dispense:  90 tablet    Refill:  1   Follow Up Instructions: Return in about 4 weeks (around 11/05/2018) for recheck anxiety. Please get labs done in 1-2 weeks .    I discussed the assessment and treatment plan with the patient. The patient was provided an opportunity to ask questions and all were answered. The patient agreed with the plan and demonstrated an understanding of the instructions.   The patient was advised to call back or seek an in-person evaluation if the  symptoms worsen or if the condition fails to improve as anticipated.  I provided 25 minutes of non-face-to-face time during this encounter.                      Historical information moved to improve visibility of documentation.  Past Medical History:  Diagnosis Date  . Ankle pain 04/24/2011  . Anxiety 08/04/2015  . Neuropathy 08/04/2015   Past Surgical History:  Procedure Laterality Date  . ABDOMINAL HYSTERECTOMY     for Cervical Cancer   . ANKLE SURGERY    . FOOT SURGERY    . GALLBLADDER SURGERY    . LEG SURGERY     Social History   Tobacco Use  . Smoking status: Current Every Day Smoker    Packs/day: 1.00    Years: 20.00    Pack years: 20.00    Types: Cigarettes  . Smokeless tobacco: Never Used  Substance Use Topics  . Alcohol use: Yes    Alcohol/week: 1.0 standard drinks    Types: 1 Standard drinks or equivalent per week   family history includes Depression in her father and mother; Hyperlipidemia in her mother; Hypertension in her mother.  Medications: Current Outpatient Medications  Medication Sig Dispense Refill  . aspirin 81 MG tablet Take 81 mg by mouth daily.    Marland Kitchen gabapentin (NEURONTIN) 300 MG capsule Take 1 capsule (300 mg total) by mouth 3 (three) times daily as needed. 90 capsule 1  . promethazine (PHENERGAN) 25 MG tablet Take 1 tablet (25 mg total) by mouth every 8 (eight) hours as needed for nausea or vomiting. 60 tablet 0  . escitalopram (LEXAPRO) 5 MG tablet Take 1 tablet (5 mg total) by mouth at bedtime. 90 tablet 1  . propranolol ER (INDERAL LA) 60 MG 24 hr capsule Take 1 capsule (60 mg total) by mouth daily. 30 capsule 1   No current facility-administered medications for this visit.    No Known Allergies  PDMP not reviewed this encounter. Orders Placed This Encounter  Procedures  . TSH  . COMPLETE METABOLIC PANEL WITH GFR  . CBC   Meds ordered this encounter  Medications  . gabapentin (NEURONTIN) 300 MG capsule    Sig:  Take 1 capsule (300 mg total) by mouth 3 (three) times daily as needed.    Dispense:  90 capsule    Refill:  1  . promethazine (PHENERGAN) 25 MG tablet    Sig: Take 1 tablet (25 mg total) by mouth every 8 (eight) hours as  needed for nausea or vomiting.    Dispense:  60 tablet    Refill:  0    No refills, last visit was over a yr ago. Pt is overdue for f/u visit w/Provider.  . propranolol ER (INDERAL LA) 60 MG 24 hr capsule    Sig: Take 1 capsule (60 mg total) by mouth daily.    Dispense:  30 capsule    Refill:  1  . escitalopram (LEXAPRO) 5 MG tablet    Sig: Take 1 tablet (5 mg total) by mouth at bedtime.    Dispense:  90 tablet    Refill:  1

## 2018-11-06 ENCOUNTER — Ambulatory Visit: Payer: Self-pay | Admitting: Osteopathic Medicine

## 2018-11-06 ENCOUNTER — Other Ambulatory Visit: Payer: Self-pay | Admitting: Osteopathic Medicine

## 2018-11-07 NOTE — Telephone Encounter (Signed)
I tried to call patient. Phone just rang, no answer. She is in need of a follow up.

## 2018-11-13 ENCOUNTER — Other Ambulatory Visit: Payer: Self-pay | Admitting: Osteopathic Medicine

## 2018-11-13 ENCOUNTER — Encounter: Payer: Self-pay | Admitting: Osteopathic Medicine

## 2018-11-13 ENCOUNTER — Ambulatory Visit (INDEPENDENT_AMBULATORY_CARE_PROVIDER_SITE_OTHER): Payer: Self-pay | Admitting: Osteopathic Medicine

## 2018-11-13 VITALS — BP 141/95 | HR 75 | Temp 98.8°F | Wt 144.4 lb

## 2018-11-13 DIAGNOSIS — R002 Palpitations: Secondary | ICD-10-CM

## 2018-11-13 DIAGNOSIS — G629 Polyneuropathy, unspecified: Secondary | ICD-10-CM

## 2018-11-13 DIAGNOSIS — M791 Myalgia, unspecified site: Secondary | ICD-10-CM

## 2018-11-13 DIAGNOSIS — Z23 Encounter for immunization: Secondary | ICD-10-CM

## 2018-11-13 NOTE — Progress Notes (Signed)
HPI: Deborah Zhang is a 48 y.o. female who  has a past medical history of Ankle pain (04/24/2011), Anxiety (08/04/2015), and Neuropathy (08/04/2015).  she presents to [facility] today, 11/13/18,  for chief complaint of:  Concern w/ blood pressure   Hasn't gotten labs done yet which were ordered 10/08/18. At that virtual visit, some concerns for increased panic issues, anxiety. Reported heart racing when anxious. We tried low dose Lexapro and propranolol, reports taking these diligently for awhile but she stopped the Lexapro 2 weeks ago. Still on the propranolol. Reports BP drops really low after taking the medicine and she feels tired after taking it. Reports higher-salt meals will also cause BP to be high. Active smoking 1 ppd+. Still having palpitations, feels like heart is racing.       At today's visit 11/13/18 ... PMH, PSH, FH reviewed and updated as needed.  Current medication list and allergy/intolerance hx reviewed and updated as needed. (See remainder of HPI, ROS, Phys Exam below)  BP Readings from Last 3 Encounters:  11/13/18 (!) 141/95  10/08/18 (!) 159/100  07/24/17 (!) 145/85             ASSESSMENT/PLAN: The primary encounter diagnosis was Palpitations. A diagnosis of Need for Tdap vaccination was also pertinent to this visit.   Orders Placed This Encounter  Procedures  . Tdap vaccine greater than or equal to 7yo IM  . EKG 12-Lead     No orders of the defined types were placed in this encounter.   Patient Instructions  Plan:  Palpitations: I think most likely due to anxiety but we will need to rule out anything going on with the heart.  Please get blood work done today, we will get you set up for a heart monitor.   High blood pressure: Please be checking your blood pressure at home a couple of times per day. As long as it is 140/90 or less than that, nothing to worry about.  If it is higher than that, wait 5 minutes  and recheck.  If you are having numbers consistently higher than 160/100, please let me know.  If you experience severe chest pain, headache, vision changes, dizziness or other concerns please seek emergency medical attention.  Let's try taking the propranolol in the evenings.  Ideally, I would just skip the afternoon dose and take it at dinnertime today, and then at bedtime tomorrow.  If you feel you need to take it sooner and move the timing back gradually, that is also fine.  Let's plan to have a virtual visit in about a week to touch base and see how you are doing.  Please call or message me sooner if anything comes up!       Follow-up plan: Return for Virtual visit in about 1 week to recheck on blood pressure and palpitations.                                                 ################################################# ################################################# ################################################# #################################################    Current Meds  Medication Sig  . aspirin 81 MG tablet Take 81 mg by mouth daily.  Marland Kitchen gabapentin (NEURONTIN) 300 MG capsule Take 1 capsule (300 mg total) by mouth 3 (three) times daily as needed.  . promethazine (PHENERGAN) 25 MG tablet Take 1 tablet (25 mg total) by  mouth every 8 (eight) hours as needed for nausea or vomiting.  . propranolol ER (INDERAL LA) 60 MG 24 hr capsule Take 1 capsule (60 mg total) by mouth daily. Needs to schedule a follow up appointment.    No Known Allergies     Review of Systems:  Constitutional: No recent illness  HEENT: No  headache, no vision change  Cardiac: No  chest pain, No  pressure, +palpitations  Respiratory:  No  shortness of breath. No  Cough  Gastrointestinal: No  abdominal pain, no change on bowel habits  Musculoskeletal: No new myalgia/arthralgia  Skin: No  Rash  Neurologic: No  weakness, No   Dizziness  Psychiatric: No  concerns with depression, +concerns with anxiety  Exam:  BP (!) 141/95 (BP Location: Left Arm, Patient Position: Sitting, Cuff Size: Normal)   Pulse 75   Temp 98.8 F (37.1 C) (Oral)   Wt 144 lb 6.4 oz (65.5 kg)   BMI 22.62 kg/m   Constitutional: VS see above. General Appearance: alert, well-developed, well-nourished, NAD  Eyes: Normal lids and conjunctive, non-icteric sclera  Ears, Nose, Mouth, Throat: MMM, Normal external inspection ears/nares/mouth/lips/gums.  Neck: No masses, trachea midline.   Respiratory: Normal respiratory effort. no wheeze, no rhonchi, no rales  Cardiovascular: S1/S2 normal, no murmur, no rub/gallop auscultated. RRR.   Musculoskeletal: Gait normal. Symmetric and independent movement of all extremities  Neurological: Normal balance/coordination. No tremor.  Skin: warm, dry, intact.   Psychiatric: Normal judgment/insight. Normal mood and affect. Oriented x3.   EKG interpretation: Rate: 68 Rhythm: sinus No ST/T changes concerning for acute ischemia/infarct  Previous EKG no tracings for review      Visit summary with medication list and pertinent instructions was printed for patient to review, patient was advised to alert [place] if any updates are needed. All questions at time of visit were answered - patient instructed to contact office with any additional concerns. ER/RTC precautions were reviewed with the patient and understanding verbalized.     Please note: voice recognition software was used to produce this document, and typos may escape review. Please contact Dr. Sheppard Coil for any needed clarifications.    Follow up plan: Return for Virtual visit in about 1 week to recheck on blood pressure and palpitations.

## 2018-11-13 NOTE — Patient Instructions (Addendum)
Plan:  Palpitations: I think most likely due to anxiety but we will need to rule out anything going on with the heart.  Please get blood work done today, we will get you set up for a heart monitor.   High blood pressure: Please be checking your blood pressure at home a couple of times per day. As long as it is 140/90 or less than that, nothing to worry about.  If it is higher than that, wait 5 minutes and recheck.  If you are having numbers consistently higher than 160/100, please let me know.  If you experience severe chest pain, headache, vision changes, dizziness or other concerns please seek emergency medical attention.  Let's try taking the propranolol in the evenings.  Ideally, I would just skip the afternoon dose and take it at dinnertime today, and then at bedtime tomorrow.  If you feel you need to take it sooner and move the timing back gradually, that is also fine.  Let's plan to have a virtual visit in about a week to touch base and see how you are doing.  Please call or message me sooner if anything comes up!

## 2018-11-14 ENCOUNTER — Other Ambulatory Visit: Payer: Self-pay | Admitting: Osteopathic Medicine

## 2018-11-14 DIAGNOSIS — R002 Palpitations: Secondary | ICD-10-CM

## 2018-11-14 LAB — CBC
HCT: 44.1 % (ref 35.0–45.0)
Hemoglobin: 15 g/dL (ref 11.7–15.5)
MCH: 35 pg — ABNORMAL HIGH (ref 27.0–33.0)
MCHC: 34 g/dL (ref 32.0–36.0)
MCV: 102.8 fL — ABNORMAL HIGH (ref 80.0–100.0)
MPV: 9.6 fL (ref 7.5–12.5)
Platelets: 309 10*3/uL (ref 140–400)
RBC: 4.29 10*6/uL (ref 3.80–5.10)
RDW: 11 % (ref 11.0–15.0)
WBC: 13 10*3/uL — ABNORMAL HIGH (ref 3.8–10.8)

## 2018-11-14 LAB — COMPLETE METABOLIC PANEL WITH GFR
AG Ratio: 1.6 (calc) (ref 1.0–2.5)
ALT: 13 U/L (ref 6–29)
AST: 19 U/L (ref 10–35)
Albumin: 4.2 g/dL (ref 3.6–5.1)
Alkaline phosphatase (APISO): 64 U/L (ref 31–125)
BUN: 9 mg/dL (ref 7–25)
CO2: 25 mmol/L (ref 20–32)
Calcium: 9.5 mg/dL (ref 8.6–10.2)
Chloride: 103 mmol/L (ref 98–110)
Creat: 0.67 mg/dL (ref 0.50–1.10)
GFR, Est African American: 122 mL/min/{1.73_m2} (ref >=60)
GFR, Est Non African American: 105 mL/min/{1.73_m2} (ref >=60)
Globulin: 2.7 g/dL (calc) (ref 1.9–3.7)
Glucose, Bld: 106 mg/dL — ABNORMAL HIGH (ref 65–99)
Potassium: 4.4 mmol/L (ref 3.5–5.3)
Sodium: 138 mmol/L (ref 135–146)
Total Bilirubin: 0.5 mg/dL (ref 0.2–1.2)
Total Protein: 6.9 g/dL (ref 6.1–8.1)

## 2018-11-14 LAB — TSH: TSH: 2.7 mIU/L

## 2018-11-20 ENCOUNTER — Ambulatory Visit: Payer: Self-pay | Admitting: Osteopathic Medicine

## 2018-11-21 ENCOUNTER — Telehealth: Payer: Self-pay | Admitting: Radiology

## 2018-11-21 NOTE — Telephone Encounter (Signed)
Enrolled patient for a 14 day Zio monitor to be mailed. Brief instructions were gone over with the patient and she knows to expect the monitor to arrive in 3-4 days. *patient is currently self pay instructed her to talk to [facility] about self pay/hardship options.

## 2018-12-04 ENCOUNTER — Other Ambulatory Visit: Payer: Self-pay | Admitting: Osteopathic Medicine

## 2018-12-12 ENCOUNTER — Other Ambulatory Visit: Payer: Self-pay | Admitting: Osteopathic Medicine

## 2018-12-12 DIAGNOSIS — G629 Polyneuropathy, unspecified: Secondary | ICD-10-CM

## 2018-12-12 DIAGNOSIS — M791 Myalgia, unspecified site: Secondary | ICD-10-CM

## 2018-12-16 ENCOUNTER — Telehealth: Payer: Self-pay

## 2018-12-16 NOTE — Telephone Encounter (Addendum)
Pt left a vm msg stating that she is still not feeling well. She sent the heart monitor back to the distributor because it would not stay attached to her. She is still not feeling well. Pt states she takes propranolol rx around 9 pm, but blood pressure continue to be out of range. Today's bp was 148/102. Pt has an appt tomorrow at 950 am.

## 2018-12-17 ENCOUNTER — Ambulatory Visit (INDEPENDENT_AMBULATORY_CARE_PROVIDER_SITE_OTHER): Payer: Self-pay | Admitting: Osteopathic Medicine

## 2018-12-17 ENCOUNTER — Encounter: Payer: Self-pay | Admitting: Osteopathic Medicine

## 2018-12-17 VITALS — BP 119/85 | HR 81 | Temp 98.4°F | Wt 146.0 lb

## 2018-12-17 DIAGNOSIS — I10 Essential (primary) hypertension: Secondary | ICD-10-CM

## 2018-12-17 DIAGNOSIS — F419 Anxiety disorder, unspecified: Secondary | ICD-10-CM

## 2018-12-17 DIAGNOSIS — R002 Palpitations: Secondary | ICD-10-CM

## 2018-12-17 HISTORY — DX: Essential (primary) hypertension: I10

## 2018-12-17 MED ORDER — METOPROLOL SUCCINATE ER 50 MG PO TB24: 50.0000 mg | ORAL_TABLET | Freq: Every day | ORAL | 1 refills | Status: DC

## 2018-12-17 NOTE — Telephone Encounter (Signed)
See progress note from 12/17/18

## 2018-12-17 NOTE — Patient Instructions (Signed)
Will stop propranolol and trial metoprolol. Hopefully this may last a bit longer in your system and you won't experience the blood pressure fluctuations. Will call you to follow-up soon!

## 2018-12-17 NOTE — Progress Notes (Signed)
Virtual Visit via Phone Note  I connected with      [name] on 12/17/18 at 9:50 AM by a telemedicine application and verified that I am speaking with the correct person using two identifiers.  Patient is at home I am in office   I discussed the limitations of evaluation and management by telemedicine and the availability of in person appointments. The patient expressed understanding and agreed to proceed.  History of Present Illness: Deborah Zhang is a 48 y.o. female who would like to discuss blood pressure    Seen a little over a month ago w/ palpitations, had been on propranolol but reported BP dropping with that medicine. Advised take this in the evenings.  She did start taking this at bedtime, now taking it around 9 PM and was doing well for awhile.   Now, reports BP fluctuates, notes higher in the evenings, associated w/ dizziness and nausea, w/ numbers measuring as high as 419/622 systolic. Took meds at 5:30 last night (a bit earlier than usual) and felt a bit better. Currently measuring 119/85. Meds: Propranolol ER 60 mg. Was unable to keep Zio monitor in place.        Observations/Objective: BP 119/85 (BP Location: Left Arm, Patient Position: Sitting, Cuff Size: Normal)   Pulse 81   Temp 98.4 F (36.9 C) (Oral)   Wt 146 lb (66.2 kg)   BMI 22.87 kg/m  BP Readings from Last 3 Encounters:  12/17/18 119/85  11/13/18 (!) 141/95  10/08/18 (!) 159/100   Exam: Normal Speech.    Lab and Radiology Results No results found for this or any previous visit (from the past 72 hour(s)). No results found.     Assessment and Plan: 48 y.o. female with The primary encounter diagnosis was Palpitations. Diagnoses of Anxiety and Essential hypertension were also pertinent to this visit.   PDMP not reviewed this encounter. No orders of the defined types were placed in this encounter.  Meds ordered this encounter  Medications  . metoprolol succinate (TOPROL-XL) 50  MG 24 hr tablet    Sig: Take 1 tablet (50 mg total) by mouth daily. Take with or immediately following a meal.    Dispense:  90 tablet    Refill:  1    Cancel propranolol   Patient Instructions  Will stop propranolol and trial metoprolol. Hopefully this may last a bit longer in your system and you won't experience the blood pressure fluctuations. Will call you to follow-up soon!    Instructions sent via MyChart. If MyChart not available, pt was given option for info via personal e-mail w/ no guarantee of protected health info over unsecured e-mail communication, and MyChart sign-up instructions were included.   Follow Up Instructions: Return in about 1 week (around 12/24/2018) for phone/virtual visit follow-up on new blood pressure medication .    I discussed the assessment and treatment plan with the patient. The patient was provided an opportunity to ask questions and all were answered. The patient agreed with the plan and demonstrated an understanding of the instructions.   The patient was advised to call back or seek an in-person evaluation if any new concerns, if symptoms worsen or if the condition fails to improve as anticipated.  15 minutes of non-face-to-face time was provided during this encounter.                      Historical information moved to improve visibility of documentation.  Past Medical History:  Diagnosis Date  . Ankle pain 04/24/2011  . Anxiety 08/04/2015  . Neuropathy 08/04/2015   Past Surgical History:  Procedure Laterality Date  . ABDOMINAL HYSTERECTOMY     for Cervical Cancer   . ANKLE SURGERY    . FOOT SURGERY    . GALLBLADDER SURGERY    . LEG SURGERY     Social History   Tobacco Use  . Smoking status: Current Every Day Smoker    Packs/day: 1.00    Years: 20.00    Pack years: 20.00    Types: Cigarettes  . Smokeless tobacco: Never Used  Substance Use Topics  . Alcohol use: Yes    Alcohol/week: 1.0 standard drinks    Types: 1  Standard drinks or equivalent per week   family history includes Depression in her father and mother; Hyperlipidemia in her mother; Hypertension in her mother.  Medications: Current Outpatient Medications  Medication Sig Dispense Refill  . aspirin 81 MG tablet Take 81 mg by mouth daily.    Marland Kitchen gabapentin (NEURONTIN) 300 MG capsule Take 1 capsule (300 mg total) by mouth 3 (three) times daily as needed. 90 capsule 0  . promethazine (PHENERGAN) 25 MG tablet Take 1 tablet (25 mg total) by mouth every 8 (eight) hours as needed for nausea or vomiting. 60 tablet 0  . metoprolol succinate (TOPROL-XL) 50 MG 24 hr tablet Take 1 tablet (50 mg total) by mouth daily. Take with or immediately following a meal. 90 tablet 1   No current facility-administered medications for this visit.    No Known Allergies  PDMP not reviewed this encounter. No orders of the defined types were placed in this encounter.  Meds ordered this encounter  Medications  . metoprolol succinate (TOPROL-XL) 50 MG 24 hr tablet    Sig: Take 1 tablet (50 mg total) by mouth daily. Take with or immediately following a meal.    Dispense:  90 tablet    Refill:  1    Cancel propranolol

## 2018-12-18 NOTE — Telephone Encounter (Signed)
Noted  

## 2018-12-24 ENCOUNTER — Telehealth: Payer: Self-pay

## 2018-12-24 ENCOUNTER — Encounter: Payer: Self-pay | Admitting: Osteopathic Medicine

## 2018-12-24 ENCOUNTER — Ambulatory Visit (INDEPENDENT_AMBULATORY_CARE_PROVIDER_SITE_OTHER): Payer: Self-pay | Admitting: Osteopathic Medicine

## 2018-12-24 VITALS — BP 141/95 | HR 88 | Temp 98.1°F | Wt 145.0 lb

## 2018-12-24 DIAGNOSIS — F419 Anxiety disorder, unspecified: Secondary | ICD-10-CM

## 2018-12-24 DIAGNOSIS — I10 Essential (primary) hypertension: Secondary | ICD-10-CM

## 2018-12-24 DIAGNOSIS — R002 Palpitations: Secondary | ICD-10-CM

## 2018-12-24 MED ORDER — HYDROCHLOROTHIAZIDE 12.5 MG PO TABS: 12.5000 mg | ORAL_TABLET | Freq: Every day | ORAL | 1 refills | Status: DC

## 2018-12-24 NOTE — Telephone Encounter (Signed)
Please call pt to schedule follow up

## 2018-12-24 NOTE — Telephone Encounter (Signed)
-----   Message from Emeterio Reeve, DO sent at 12/24/2018  2:05 PM EDT -----  Follow Up Instructions: Return in about 1 week (around 12/31/2018) for RECHECK ON NEW MEDICATION FOR BLOOD PRESSURE .

## 2018-12-24 NOTE — Progress Notes (Signed)
Virtual Visit via Phone  I connected with      Deborah Zhang on 12/24/18 at 1:39 PM  by a telemedicine application and verified that I am speaking with the correct person using two identifiers.  Patient is at home I am in office   I discussed the limitations of evaluation and management by telemedicine and the availability of in person appointments. The patient expressed understanding and agreed to proceed.  History of Present Illness: Deborah Zhang is a 48 y.o. female who would like to discuss BP / Palpitations follow-up    See last note 12/17/2018 last week. Issues w/ subjective palpitations and noted BP fluctuations. Pt was on Propranolol ER but felt this was wearing off and BP was going up. We switched to Metoprolol ER   Today, reports BP still fluctuating 100s/70s - 140s/90s. Rechecked few minutes ago, was 138/95. Hasn't felt any problems when it's higher, feels like she will get headaches when BP is higher but feels fatigued when BP is lower. No CP/SOB.    Social History   Tobacco Use  Smoking Status Current Every Day Smoker  . Packs/day: 1.00  . Years: 20.00  . Pack years: 20.00  . Types: Cigarettes  Smokeless Tobacco Never Used         Observations/Objective: BP (!) 141/95 (Patient Position: Sitting, Cuff Size: Normal)   Pulse 88   Temp 98.1 F (36.7 C) (Oral)   Wt 145 lb (65.8 kg)   BMI 22.71 kg/m  BP Readings from Last 3 Encounters:  12/24/18 (!) 141/95  12/17/18 119/85  11/13/18 (!) 141/95   Exam: Normal Speech.    Lab and Radiology Results No results found for this or any previous visit (from the past 72 hour(s)). No results found.     Assessment and Plan: 48 y.o. female with The primary encounter diagnosis was Essential hypertension. Diagnoses of Anxiety and Palpitations were also pertinent to this visit.   PDMP not reviewed this encounter./ No orders of the defined types were placed in this encounter.   Continue Beta blocker  since it's helped w/ palpitations Anxiety probably a factor but pt reports she checkes BP after feeling "not myself" rather than getting panicked after checking BP if she was feeling fine otherwise   To avoid exacerbating dizziness/fatigue, let's try adding low dose HCTZ rather than increasing metoprolol   Meds ordered this encounter  Medications  . hydrochlorothiazide (HYDRODIURIL) 12.5 MG tablet    Sig: Take 1 tablet (12.5 mg total) by mouth daily. (start with half tablet 6.25 mg daily for the first 4 days)    Dispense:  30 tablet    Refill:  1   There are no Patient Instructions on file for this visit.  Instructions sent via MyChart. If MyChart not available, pt was given option for info via personal e-mail w/ no guarantee of protected health info over unsecured e-mail communication, and MyChart sign-up instructions were included.   Follow Up Instructions: Return in about 1 week (around 12/31/2018) for RECHECK ON NEW MEDICATION FOR BLOOD PRESSURE .    I discussed the assessment and treatment plan with the patient. The patient was provided an opportunity to ask questions and all were answered. The patient agreed with the plan and demonstrated an understanding of the instructions.   The patient was advised to call back or seek an in-person evaluation if any new concerns, if symptoms worsen or if the condition fails to improve as anticipated.  15 minutes  of non-face-to-face time was provided during this encounter.                      Historical information moved to improve visibility of documentation.  Past Medical History:  Diagnosis Date  . Ankle pain 04/24/2011  . Anxiety 08/04/2015  . Neuropathy 08/04/2015   Past Surgical History:  Procedure Laterality Date  . ABDOMINAL HYSTERECTOMY     for Cervical Cancer   . ANKLE SURGERY    . FOOT SURGERY    . GALLBLADDER SURGERY    . LEG SURGERY     Social History   Tobacco Use  . Smoking status: Current Every Day  Smoker    Packs/day: 1.00    Years: 20.00    Pack years: 20.00    Types: Cigarettes  . Smokeless tobacco: Never Used  Substance Use Topics  . Alcohol use: Yes    Alcohol/week: 1.0 standard drinks    Types: 1 Standard drinks or equivalent per week   family history includes Depression in her father and mother; Hyperlipidemia in her mother; Hypertension in her mother.  Medications: Current Outpatient Medications  Medication Sig Dispense Refill  . aspirin 81 MG tablet Take 81 mg by mouth daily.    Marland Kitchen gabapentin (NEURONTIN) 300 MG capsule Take 1 capsule (300 mg total) by mouth 3 (three) times daily as needed. 90 capsule 0  . metoprolol succinate (TOPROL-XL) 50 MG 24 hr tablet Take 1 tablet (50 mg total) by mouth daily. Take with or immediately following a meal. 90 tablet 1  . promethazine (PHENERGAN) 25 MG tablet Take 1 tablet (25 mg total) by mouth every 8 (eight) hours as needed for nausea or vomiting. 60 tablet 0  . hydrochlorothiazide (HYDRODIURIL) 12.5 MG tablet Take 1 tablet (12.5 mg total) by mouth daily. (start with half tablet 6.25 mg daily for the first 4 days) 30 tablet 1   No current facility-administered medications for this visit.    No Known Allergies  PDMP not reviewed this encounter. No orders of the defined types were placed in this encounter.  Meds ordered this encounter  Medications  . hydrochlorothiazide (HYDRODIURIL) 12.5 MG tablet    Sig: Take 1 tablet (12.5 mg total) by mouth daily. (start with half tablet 6.25 mg daily for the first 4 days)    Dispense:  30 tablet    Refill:  1

## 2018-12-25 NOTE — Telephone Encounter (Signed)
Appointment has been made for next week. No further questions at this time.

## 2018-12-31 ENCOUNTER — Ambulatory Visit (INDEPENDENT_AMBULATORY_CARE_PROVIDER_SITE_OTHER): Payer: Self-pay | Admitting: Osteopathic Medicine

## 2018-12-31 ENCOUNTER — Encounter: Payer: Self-pay | Admitting: Osteopathic Medicine

## 2018-12-31 VITALS — BP 126/86 | HR 73 | Temp 98.4°F | Wt 146.0 lb

## 2018-12-31 DIAGNOSIS — G629 Polyneuropathy, unspecified: Secondary | ICD-10-CM

## 2018-12-31 DIAGNOSIS — M791 Myalgia, unspecified site: Secondary | ICD-10-CM

## 2018-12-31 DIAGNOSIS — I10 Essential (primary) hypertension: Secondary | ICD-10-CM

## 2018-12-31 MED ORDER — HYDROCHLOROTHIAZIDE 12.5 MG PO TABS: 12.5000 mg | ORAL_TABLET | Freq: Every day | ORAL | 1 refills | Status: DC

## 2018-12-31 MED ORDER — GABAPENTIN 300 MG PO CAPS: ORAL_CAPSULE | ORAL | 1 refills | Status: DC

## 2018-12-31 NOTE — Progress Notes (Signed)
Attempted to contact at 1210 pm , no answer. Unable to leave a vm msg.

## 2018-12-31 NOTE — Progress Notes (Signed)
Virtual Visit via Phone  I connected with      Deborah Zhang on 12/31/18 at 1:11 PM  by a telemedicine application and verified that I am speaking with the correct person using two identifiers.  Patient is at home I am in office   I discussed the limitations of evaluation and management by telemedicine and the availability of in person appointments. The patient expressed understanding and agreed to proceed.  History of Present Illness: Deborah Zhang is a 48 y.o. female who would like to discuss BP / Palpitations follow-up    See note 12/17/2018. Issues w/ subjective palpitations and noted BP fluctuations. Pt was on Propranolol ER but felt this was wearing off and BP was going up. We switched to Metoprolol ER   Last visit 12/24/2018, reports BP still fluctuating 100s/70s - 140s/90s. Rechecked few minutes ago, was 138/95. Hasn't felt any problems when it's higher, feels like she will get headaches when BP is higher but feels fatigued when BP is lower. No CP/SOB.  We added low-dose HCTZ at 12.5 mg  Today 12/31/18: reports doing well, BP more stable, not feeling like she needs to check as often. Only concern is needing refill on gabapentin    Social History   Tobacco Use  Smoking Status Current Every Day Smoker  . Packs/day: 1.00  . Years: 20.00  . Pack years: 20.00  . Types: Cigarettes  Smokeless Tobacco Never Used         Observations/Objective: BP 126/86 (Patient Position: Sitting, Cuff Size: Normal)   Pulse 73   Temp 98.4 F (36.9 C) (Oral)   Wt 146 lb (66.2 kg)   BMI 22.87 kg/m  BP Readings from Last 3 Encounters:  12/31/18 126/86  12/24/18 (!) 141/95  12/17/18 119/85   Exam: Normal Speech.    Lab and Radiology Results No results found for this or any previous visit (from the past 72 hour(s)). No results found.     Assessment and Plan: 48 y.o. female with The primary encounter diagnosis was Essential hypertension. Diagnoses of Muscle pain and  Neuropathy were also pertinent to this visit.  Pt advised needs BMP checked in 4-5 weeks, orders are in and will be up front (pt is self-pay, needs stamp), doesn't have to be fasting.   PDMP not reviewed this encounter./ Orders Placed This Encounter  Procedures  . BASIC METABOLIC PANEL WITH GFR      Meds ordered this encounter  Medications  . gabapentin (NEURONTIN) 300 MG capsule    Sig: Take 1 capsule (300 mg total) up to 5 times per day as needed for neuropathy pain    Dispense:  450 capsule    Refill:  1  . hydrochlorothiazide (HYDRODIURIL) 12.5 MG tablet    Sig: Take 1 tablet (12.5 mg total) by mouth daily.    Dispense:  90 tablet    Refill:  1     Follow Up Instructions: Return in about 4 weeks (around 01/28/2019) for LAB VISIT ONLY for medication safety .    I discussed the assessment and treatment plan with the patient. The patient was provided an opportunity to ask questions and all were answered. The patient agreed with the plan and demonstrated an understanding of the instructions.   The patient was advised to call back or seek an in-person evaluation if any new concerns, if symptoms worsen or if the condition fails to improve as anticipated.  15 minutes of non-face-to-face time was provided during this  encounter.                      Historical information moved to improve visibility of documentation.  Past Medical History:  Diagnosis Date  . Ankle pain 04/24/2011  . Anxiety 08/04/2015  . Neuropathy 08/04/2015   Past Surgical History:  Procedure Laterality Date  . ABDOMINAL HYSTERECTOMY     for Cervical Cancer   . ANKLE SURGERY    . FOOT SURGERY    . GALLBLADDER SURGERY    . LEG SURGERY     Social History   Tobacco Use  . Smoking status: Current Every Day Smoker    Packs/day: 1.00    Years: 20.00    Pack years: 20.00    Types: Cigarettes  . Smokeless tobacco: Never Used  Substance Use Topics  . Alcohol use: Yes    Alcohol/week:  1.0 standard drinks    Types: 1 Standard drinks or equivalent per week   family history includes Depression in her father and mother; Hyperlipidemia in her mother; Hypertension in her mother.  Medications: Current Outpatient Medications  Medication Sig Dispense Refill  . aspirin 81 MG tablet Take 81 mg by mouth daily.    Marland Kitchen gabapentin (NEURONTIN) 300 MG capsule Take 1 capsule (300 mg total) by mouth 3 (three) times daily as needed. 90 capsule 0  . hydrochlorothiazide (HYDRODIURIL) 12.5 MG tablet Take 1 tablet (12.5 mg total) by mouth daily. (start with half tablet 6.25 mg daily for the first 4 days) 30 tablet 1  . metoprolol succinate (TOPROL-XL) 50 MG 24 hr tablet Take 1 tablet (50 mg total) by mouth daily. Take with or immediately following a meal. 90 tablet 1  . promethazine (PHENERGAN) 25 MG tablet Take 1 tablet (25 mg total) by mouth every 8 (eight) hours as needed for nausea or vomiting. 60 tablet 0   No current facility-administered medications for this visit.    No Known Allergies  PDMP not reviewed this encounter. No orders of the defined types were placed in this encounter.  No orders of the defined types were placed in this encounter.

## 2019-02-19 ENCOUNTER — Other Ambulatory Visit: Payer: Self-pay | Admitting: Osteopathic Medicine

## 2019-02-19 DIAGNOSIS — R110 Nausea: Secondary | ICD-10-CM

## 2019-03-18 ENCOUNTER — Other Ambulatory Visit: Payer: Self-pay | Admitting: Osteopathic Medicine

## 2019-03-18 DIAGNOSIS — R110 Nausea: Secondary | ICD-10-CM

## 2019-03-30 ENCOUNTER — Other Ambulatory Visit: Payer: Self-pay | Admitting: Osteopathic Medicine

## 2019-03-30 DIAGNOSIS — G629 Polyneuropathy, unspecified: Secondary | ICD-10-CM

## 2019-03-30 DIAGNOSIS — M791 Myalgia, unspecified site: Secondary | ICD-10-CM

## 2019-04-21 ENCOUNTER — Other Ambulatory Visit: Payer: Self-pay | Admitting: Osteopathic Medicine

## 2019-04-21 DIAGNOSIS — R110 Nausea: Secondary | ICD-10-CM

## 2019-05-22 ENCOUNTER — Other Ambulatory Visit: Payer: Self-pay | Admitting: Osteopathic Medicine

## 2019-05-22 DIAGNOSIS — R110 Nausea: Secondary | ICD-10-CM

## 2019-06-15 ENCOUNTER — Other Ambulatory Visit: Payer: Self-pay | Admitting: Osteopathic Medicine

## 2019-06-24 ENCOUNTER — Other Ambulatory Visit: Payer: Self-pay | Admitting: Osteopathic Medicine

## 2019-06-24 DIAGNOSIS — G629 Polyneuropathy, unspecified: Secondary | ICD-10-CM

## 2019-06-24 DIAGNOSIS — M791 Myalgia, unspecified site: Secondary | ICD-10-CM

## 2019-06-24 DIAGNOSIS — R110 Nausea: Secondary | ICD-10-CM

## 2019-07-24 ENCOUNTER — Other Ambulatory Visit: Payer: Self-pay | Admitting: Sports Medicine

## 2019-07-24 DIAGNOSIS — R110 Nausea: Secondary | ICD-10-CM

## 2019-07-24 NOTE — Telephone Encounter (Signed)
To PCP

## 2019-08-19 ENCOUNTER — Other Ambulatory Visit: Payer: Self-pay | Admitting: Osteopathic Medicine

## 2019-08-19 DIAGNOSIS — R110 Nausea: Secondary | ICD-10-CM

## 2019-08-20 ENCOUNTER — Other Ambulatory Visit: Payer: Self-pay | Admitting: Osteopathic Medicine

## 2019-08-21 NOTE — Telephone Encounter (Signed)
Attempted to contact pt, no answer. Phone keeps ringing. No option to leave a vm msg. Will attempt later on today.

## 2019-08-21 NOTE — Telephone Encounter (Signed)
Still needs labs done to monitor medication safety, this was instructed back in July, she was supposed to come in in August and still has not.  Orders are in, okay to refill 30 days but no additional refills until she gets blood work done

## 2019-08-24 NOTE — Telephone Encounter (Signed)
Attempted to contact pt, no answer. Phone keeps ringing. No option to leave a vm msg.

## 2019-08-25 NOTE — Telephone Encounter (Signed)
3rd attempt to contact patient. No answer.

## 2019-09-03 NOTE — Telephone Encounter (Signed)
Attempted to contact pt regarding med refill for hctz. Spoke to pt's partner who answered call. Pt was sleeping at time of call. Aware pt will need to complete labs before med refill. Partner informed of lab schedule / hours.

## 2019-09-11 ENCOUNTER — Other Ambulatory Visit: Payer: Self-pay | Admitting: Osteopathic Medicine

## 2019-09-11 NOTE — Telephone Encounter (Signed)
Contacted pt, informed of covering provider's note. Agreeable with plan. Pt has been scheduled for a virtual appt on 09/22/19. Unable to come into the office due to lack of transportation. She will have readings of her vitals ready at appt time. No other inquiries during call.

## 2019-09-11 NOTE — Telephone Encounter (Signed)
Patient needs appointment to follow-up on chronic issues I did go ahead and send a 30-day refill of metoprolol.

## 2019-09-18 ENCOUNTER — Other Ambulatory Visit: Payer: Self-pay | Admitting: Osteopathic Medicine

## 2019-09-18 DIAGNOSIS — R110 Nausea: Secondary | ICD-10-CM

## 2019-09-22 ENCOUNTER — Telehealth (INDEPENDENT_AMBULATORY_CARE_PROVIDER_SITE_OTHER): Payer: Self-pay | Admitting: Osteopathic Medicine

## 2019-09-22 VITALS — BP 130/86 | HR 79 | Ht 67.0 in | Wt 151.0 lb

## 2019-09-22 DIAGNOSIS — M791 Myalgia, unspecified site: Secondary | ICD-10-CM

## 2019-09-22 DIAGNOSIS — I10 Essential (primary) hypertension: Secondary | ICD-10-CM

## 2019-09-22 DIAGNOSIS — N6320 Unspecified lump in the left breast, unspecified quadrant: Secondary | ICD-10-CM

## 2019-09-22 DIAGNOSIS — R002 Palpitations: Secondary | ICD-10-CM

## 2019-09-22 DIAGNOSIS — Z86010 Personal history of colonic polyps: Secondary | ICD-10-CM

## 2019-09-22 DIAGNOSIS — G629 Polyneuropathy, unspecified: Secondary | ICD-10-CM

## 2019-09-22 MED ORDER — GABAPENTIN 300 MG PO CAPS: 300.0000 mg | ORAL_CAPSULE | Freq: Every day | ORAL | 0 refills | Status: DC | PRN

## 2019-09-22 MED ORDER — HYDROCHLOROTHIAZIDE 12.5 MG PO CAPS: 12.5000 mg | ORAL_CAPSULE | Freq: Every day | ORAL | 0 refills | Status: DC

## 2019-09-22 NOTE — Progress Notes (Signed)
Virtual Visit via Phone  I connected with      Deborah Zhang on 09/22/19 at 1:59 PM  by a telemedicine application and verified that I am speaking with the correct person using two identifiers.  Patient is at home I am in office   I discussed the limitations of evaluation and management by telemedicine and the availability of in person appointments. The patient expressed understanding and agreed to proceed.  History of Present Illness: Deborah Zhang is a 49 y.o. female who would like to discuss medication refills    Feeling occasional palpitations still - would like to get cardiac monitor (fell off last time)  Breast lump on L feels like a marble, noted over past few months, no skin chagnes or nipple discharge  HTN: no CP/HA/SOB Has health insurance again      Observations/Objective: BP 130/86   Pulse 79   Ht 5\' 7"  (1.702 m)   Wt 151 lb (68.5 kg)   BMI 23.65 kg/m  BP Readings from Last 3 Encounters:  09/22/19 130/86  12/31/18 126/86  12/24/18 (!) 141/95   Exam: Normal Speech.  NAD  Lab and Radiology Results No results found for this or any previous visit (from the past 72 hour(s)). No results found.     Assessment and Plan: 49 y.o. female with The primary encounter diagnosis was Essential hypertension. Diagnoses of Muscle pain, Neuropathy, Palpitations, Left breast lump, and History of colonic polyps were also pertinent to this visit.   PDMP not reviewed this encounter. Orders Placed This Encounter  Procedures  . MM Digital Diagnostic Bilat    Order Specific Question:   Reason for Exam (SYMPTOM  OR DIAGNOSIS REQUIRED)    Answer:   BREAST LUMP ON L    Order Specific Question:   Is the patient pregnant?    Answer:   No    Order Specific Question:   Preferred imaging location?    Answer:   [facility]  . US BREAST COMPLETE UNI LEFT INC AXILLA    Order Specific Question:   Reason for Exam (SYMPTOM  OR DIAGNOSIS REQUIRED)    Answer:   BREAST LUMP  ON L    Order Specific Question:   Preferred imaging location?    Answer:   [facility]  . US BREAST COMPLETE UNI RIGHT INC AXILLA    Order Specific Question:   Reason for Exam (SYMPTOM  OR DIAGNOSIS REQUIRED)    Answer:   BREAST LUMP ON L    Order Specific Question:   Preferred imaging location?    Answer:   [facility]  . CBC  . COMPLETE METABOLIC PANEL WITH GFR  . Lipid panel  . TSH  . Cardiac event monitor    Zio path 3-14 days    Order Specific Question:   Where should this test be performed?    Answer:   [facility]    Order Specific Question:   Release to patient    Answer:   Immediate   Meds ordered this encounter  Medications  . hydrochlorothiazide (MICROZIDE) 12.5 MG capsule    Sig: Take 1 capsule (12.5 mg total) by mouth daily. NO REFILLS UNTIL LABS DONE (ORDERS ARE IN 09/22/19)    Dispense:  90 capsule    Refill:  0  . gabapentin (NEURONTIN) 300 MG capsule    Sig: Take 1 capsule (300 mg total) by mouth 5 (five) times daily as needed.    Dispense:  450  capsule    Refill:  0    Follow Up Instructions: Return for RECHECK PENDINGLAB/OTHER RESULTS / ANNUAL PHYSICAL IN 3-6 MONTHS .    I discussed the assessment and treatment plan with the patient. The patient was provided an opportunity to ask questions and all were answered. The patient agreed with the plan and demonstrated an understanding of the instructions.   The patient was advised to call back or seek an in-person evaluation if any new concerns, if symptoms worsen or if the condition fails to improve as anticipated.  30 minutes of non-face-to-face time was provided during this encounter.      . . . . . . . . . . . . . Marland Kitchen                   Historical information moved to improve visibility of documentation.  Past Medical History:  Diagnosis Date  . Ankle pain 04/24/2011  . Anxiety 08/04/2015  . Neuropathy 08/04/2015   Past Surgical History:  Procedure Laterality  Date  . ABDOMINAL HYSTERECTOMY     for Cervical Cancer   . ANKLE SURGERY    . FOOT SURGERY    . GALLBLADDER SURGERY    . LEG SURGERY     Social History   Tobacco Use  . Smoking status: Current Every Day Smoker    Packs/day: 1.00    Years: 20.00    Pack years: 20.00    Types: Cigarettes  . Smokeless tobacco: Never Used  Substance Use Topics  . Alcohol use: Yes    Alcohol/week: 1.0 standard drinks    Types: 1 Standard drinks or equivalent per week   family history includes Depression in her father and mother; Hyperlipidemia in her mother; Hypertension in her mother.  Medications: Current Outpatient Medications  Medication Sig Dispense Refill  . gabapentin (NEURONTIN) 300 MG capsule Take 1 capsule (300 mg total) by mouth 5 (five) times daily as needed. 450 capsule 0  . hydrochlorothiazide (MICROZIDE) 12.5 MG capsule Take 1 capsule (12.5 mg total) by mouth daily. NO REFILLS UNTIL LABS DONE (ORDERS ARE IN 09/22/19) 90 capsule 0  . metoprolol succinate (TOPROL-XL) 50 MG 24 hr tablet Take 1 tablet (50 mg total) by mouth daily. Take with or immediately following a meal. 30 tablet 0  . promethazine (PHENERGAN) 25 MG tablet TAKE 1 TABLET BY MOUTH 2 TIMES DAILY AS NEEDED FOR NAUSEA. 60 tablet 0  . aspirin 81 MG tablet Take 81 mg by mouth daily.     No current facility-administered medications for this visit.   No Known Allergies

## 2019-09-29 ENCOUNTER — Other Ambulatory Visit: Payer: Self-pay | Admitting: Osteopathic Medicine

## 2019-09-29 DIAGNOSIS — N6320 Unspecified lump in the left breast, unspecified quadrant: Secondary | ICD-10-CM

## 2019-10-13 ENCOUNTER — Telehealth: Payer: Self-pay | Admitting: Osteopathic Medicine

## 2019-10-13 ENCOUNTER — Other Ambulatory Visit: Payer: Self-pay

## 2019-10-13 ENCOUNTER — Other Ambulatory Visit: Payer: Self-pay | Admitting: Osteopathic Medicine

## 2019-10-13 MED ORDER — METOPROLOL SUCCINATE ER 50 MG PO TB24: 50.0000 mg | ORAL_TABLET | Freq: Every day | ORAL | 0 refills | Status: DC

## 2019-10-13 NOTE — Telephone Encounter (Signed)
Pt wanted to let someone know she did complete her labs today. She was wondering if her BP meds could be sent in today since she completed these.

## 2019-10-13 NOTE — Telephone Encounter (Signed)
Pt's medications refills were sent to the pharmacy during her visit on 09/22/19 by provider. The only medication due for refill was metoprolol. I've sent in refills to her pharmacy on file.

## 2019-10-16 LAB — CBC
HCT: 48.9 % — ABNORMAL HIGH (ref 35.0–45.0)
Hemoglobin: 16.8 g/dL — ABNORMAL HIGH (ref 11.7–15.5)
MCH: 34.9 pg — ABNORMAL HIGH (ref 27.0–33.0)
MCHC: 34.4 g/dL (ref 32.0–36.0)
MCV: 101.7 fL — ABNORMAL HIGH (ref 80.0–100.0)
MPV: 9.4 fL (ref 7.5–12.5)
Platelets: 314 10*3/uL (ref 140–400)
RBC: 4.81 10*6/uL (ref 3.80–5.10)
RDW: 10.6 % — ABNORMAL LOW (ref 11.0–15.0)
WBC: 11.7 10*3/uL — ABNORMAL HIGH (ref 3.8–10.8)

## 2019-10-16 LAB — COMPLETE METABOLIC PANEL WITH GFR
AG Ratio: 1.5 (calc) (ref 1.0–2.5)
ALT: 10 U/L (ref 6–29)
AST: 15 U/L (ref 10–35)
Albumin: 4.3 g/dL (ref 3.6–5.1)
Alkaline phosphatase (APISO): 67 U/L (ref 31–125)
BUN: 8 mg/dL (ref 7–25)
CO2: 27 mmol/L (ref 20–32)
Calcium: 10.2 mg/dL (ref 8.6–10.2)
Chloride: 103 mmol/L (ref 98–110)
Creat: 0.63 mg/dL (ref 0.50–1.10)
GFR, Est African American: 124 mL/min/{1.73_m2} (ref >=60)
GFR, Est Non African American: 107 mL/min/{1.73_m2} (ref >=60)
Globulin: 2.9 g/dL (calc) (ref 1.9–3.7)
Glucose, Bld: 161 mg/dL — ABNORMAL HIGH (ref 65–99)
Potassium: 4.6 mmol/L (ref 3.5–5.3)
Sodium: 138 mmol/L (ref 135–146)
Total Bilirubin: 0.6 mg/dL (ref 0.2–1.2)
Total Protein: 7.2 g/dL (ref 6.1–8.1)

## 2019-10-16 LAB — TSH: TSH: 1.85 mIU/L

## 2019-10-16 LAB — LIPID PANEL
Cholesterol: 235 mg/dL — ABNORMAL HIGH (ref <200)
HDL: 62 mg/dL (ref >=50)
LDL Cholesterol (Calc): 154 mg/dL (calc) — ABNORMAL HIGH
Non-HDL Cholesterol (Calc): 173 mg/dL (calc) — ABNORMAL HIGH (ref <130)
Total CHOL/HDL Ratio: 3.8 (calc) (ref <5.0)
Triglycerides: 85 mg/dL (ref <150)

## 2019-10-16 LAB — HEMOGLOBIN A1C W/OUT EAG: Hgb A1c MFr Bld: 4.5 % of total Hgb (ref <5.7)

## 2019-10-17 HISTORY — PX: BREAST EXCISIONAL BIOPSY: SUR124

## 2019-10-19 ENCOUNTER — Other Ambulatory Visit: Payer: Self-pay | Admitting: Osteopathic Medicine

## 2019-10-19 ENCOUNTER — Ambulatory Visit
Admission: RE | Admit: 2019-10-19 | Discharge: 2019-10-19 | Disposition: A | Discharge status: Home / Self Care | Payer: BC Managed Care – PPO | Source: Ambulatory Visit | Attending: Osteopathic Medicine | Admitting: Osteopathic Medicine

## 2019-10-19 ENCOUNTER — Other Ambulatory Visit: Payer: Self-pay

## 2019-10-19 DIAGNOSIS — N6320 Unspecified lump in the left breast, unspecified quadrant: Secondary | ICD-10-CM

## 2019-10-19 DIAGNOSIS — R110 Nausea: Secondary | ICD-10-CM

## 2019-10-19 DIAGNOSIS — N6310 Unspecified lump in the right breast, unspecified quadrant: Secondary | ICD-10-CM

## 2019-10-19 DIAGNOSIS — R922 Inconclusive mammogram: Secondary | ICD-10-CM | POA: Diagnosis not present

## 2019-10-19 DIAGNOSIS — N6011 Diffuse cystic mastopathy of right breast: Secondary | ICD-10-CM | POA: Diagnosis not present

## 2019-10-19 DIAGNOSIS — N6324 Unspecified lump in the left breast, lower inner quadrant: Secondary | ICD-10-CM | POA: Diagnosis not present

## 2019-10-27 ENCOUNTER — Ambulatory Visit
Admission: RE | Admit: 2019-10-27 | Discharge: 2019-10-27 | Disposition: A | Discharge status: Home / Self Care | Payer: BC Managed Care – PPO | Source: Ambulatory Visit | Attending: Osteopathic Medicine | Admitting: Osteopathic Medicine

## 2019-10-27 ENCOUNTER — Other Ambulatory Visit: Payer: Self-pay

## 2019-10-27 ENCOUNTER — Other Ambulatory Visit: Payer: Self-pay | Admitting: Osteopathic Medicine

## 2019-10-27 DIAGNOSIS — N6323 Unspecified lump in the left breast, lower outer quadrant: Secondary | ICD-10-CM | POA: Diagnosis not present

## 2019-10-27 DIAGNOSIS — N6320 Unspecified lump in the left breast, unspecified quadrant: Secondary | ICD-10-CM

## 2019-10-27 DIAGNOSIS — N6022 Fibroadenosis of left breast: Secondary | ICD-10-CM | POA: Diagnosis not present

## 2019-10-28 ENCOUNTER — Other Ambulatory Visit: Payer: Self-pay | Admitting: Osteopathic Medicine

## 2019-10-28 DIAGNOSIS — N6320 Unspecified lump in the left breast, unspecified quadrant: Secondary | ICD-10-CM

## 2019-10-30 ENCOUNTER — Telehealth: Payer: Self-pay | Admitting: Cardiology

## 2019-10-30 NOTE — Telephone Encounter (Signed)
Patient states she is returning a call from our office. However, she did not receive a voice message and no current notes are available. I made the patient aware that the call may have been in reference to appointment scheduled for 11/04/19 at 10:40 AM with Dr. Agustin Cree, as it may have been an appointment reminder. Patient verbalized understanding.

## 2019-11-03 ENCOUNTER — Other Ambulatory Visit: Payer: Self-pay

## 2019-11-04 ENCOUNTER — Other Ambulatory Visit: Payer: Self-pay

## 2019-11-04 ENCOUNTER — Ambulatory Visit: Payer: BC Managed Care – PPO | Admitting: Cardiology

## 2019-11-04 ENCOUNTER — Ambulatory Visit (INDEPENDENT_AMBULATORY_CARE_PROVIDER_SITE_OTHER): Payer: BC Managed Care – PPO

## 2019-11-04 ENCOUNTER — Encounter: Payer: Self-pay | Admitting: Cardiology

## 2019-11-04 VITALS — BP 130/80 | HR 74 | Ht 67.0 in | Wt 150.0 lb

## 2019-11-04 DIAGNOSIS — R0600 Dyspnea, unspecified: Secondary | ICD-10-CM

## 2019-11-04 DIAGNOSIS — R002 Palpitations: Secondary | ICD-10-CM | POA: Insufficient documentation

## 2019-11-04 DIAGNOSIS — F419 Anxiety disorder, unspecified: Secondary | ICD-10-CM | POA: Diagnosis not present

## 2019-11-04 DIAGNOSIS — R0609 Other forms of dyspnea: Secondary | ICD-10-CM | POA: Insufficient documentation

## 2019-11-04 DIAGNOSIS — I10 Essential (primary) hypertension: Secondary | ICD-10-CM | POA: Diagnosis not present

## 2019-11-04 NOTE — Progress Notes (Signed)
Cardiology Consultation:    Date:  11/04/2019   ID:  Deborah Zhang, DOB 07-25-1970, MRN ID:[id number]  PCP:  Deborah Reeve, DO  Cardiologist:  Deborah Campus, MD   Referring MD: Deborah Reeve, DO   Chief Complaint  Patient presents with  . New Patient (Initial Visit)  Palpitations  History of Present Illness:    Deborah Zhang is a 49 y.o. female who is being seen today for the evaluation of palpitations at the request of Deborah Reeve, DO.  She had past medical history significant for anxiety for many years, recently recognized essential hypertension, she was referred to [place] because of episode of palpitations.  She has to kind of palpitations #1 she feels sometimes her heart skipping that kind of take her breath away for a second.  She also described to have situation that her heart will speeds up.  It is gradual onset gradual offset.  There is no shortness of breath associated with this sensation no chest pain.  She does have anxiety and she does have anxiety for years and she thinks maybe her palpitations are related to it.  She was put on propranolol with very limited response now she is on metoprolol which is also given to take care of her high blood pressure.  Treatment appears to be effective for her hypertension blood palpitation is still bothering her.  She is a chronic smoker has been smoking her whole life try to quit few times obviously never succeeded.  Does not have family history of premature coronary artery disease.  She is trying to be active and walk with no major difficulties except for the fact she does have some chronic injury to the right leg which required multiple surgeries she got some metal plate over there and is walking fast for her is difficult because of that. Recently she was diagnosed with some nodules in her breast in my understanding is that surgery is contemplated. Past Medical History:  Diagnosis Date  . Ankle pain 04/24/2011  . Anxiety  08/04/2015  . Benzodiazepine dependence ([place]) 12/02/2015   History of withdrawal   . Chronic nausea 08/04/2015  . Essential hypertension 12/17/2018  . Generalized anxiety disorder 12/02/2015   Initiate exercise therapy while we taper down on benzodiazepines. Urgent referral to psychiatry.    Marland Kitchen History of colonic polyps 07/08/2016   Pathology benign 06/2016, f/u colonoscopy 5 years, normal random colon biopsies w/ no colitis  . Muscle pain 12/02/2015   Consider changing dose of gabapentin and could increase amitriptyline, we'll discuss this further at next visit   . Neuropathy 08/04/2015    Past Surgical History:  Procedure Laterality Date  . ABDOMINAL HYSTERECTOMY     for Cervical Cancer   . ANKLE SURGERY    . FOOT SURGERY    . GALLBLADDER SURGERY    . LEG SURGERY      Current Medications: Current Meds  Medication Sig  . aspirin 81 MG tablet Take 81 mg by mouth daily.  Marland Kitchen gabapentin (NEURONTIN) 300 MG capsule Take 1 capsule (300 mg total) by mouth 5 (five) times daily as needed.  . hydrochlorothiazide (MICROZIDE) 12.5 MG capsule Take 1 capsule (12.5 mg total) by mouth daily. NO REFILLS UNTIL LABS DONE (ORDERS ARE IN 09/22/19)  . metoprolol succinate (TOPROL-XL) 50 MG 24 hr tablet Take 1 tablet (50 mg total) by mouth daily. Take with or immediately following a meal.  . promethazine (PHENERGAN) 25 MG tablet TAKE 1 TABLET BY MOUTH 2 TIMES  DAILY AS NEEDED FOR NAUSEA.     Allergies:   Patient has no known allergies.   Social History   Socioeconomic History  . Marital status: Single    Spouse name: Deborah Zhang  . Number of children: 2  . Years of education: GED  . Highest education level: Not on file  Occupational History  . Occupation: homemaker  Tobacco Use  . Smoking status: Current Every Day Smoker    Packs/day: 1.00    Years: 20.00    Pack years: 20.00    Types: Cigarettes  . Smokeless tobacco: Never Used  Substance and Sexual Activity  . Alcohol use: Yes    Alcohol/week: 1.0  standard drinks    Types: 1 Standard drinks or equivalent per week  . Drug use: No  . Sexual activity: Yes    Partners: Male  Other Topics Concern  . Not on file  Social History Narrative  . Not on file   Social Determinants of Health   Financial Resource Strain:   . Difficulty of Paying Living Expenses:   Food Insecurity:   . Worried About Charity fundraiser in the Last Year:   . Arboriculturist in the Last Year:   Transportation Needs:   . Film/video editor (Medical):   Marland Kitchen Lack of Transportation (Non-Medical):   Physical Activity:   . Days of Exercise per Week:   . Minutes of Exercise per Session:   Stress:   . Feeling of Stress :   Social Connections:   . Frequency of Communication with Friends and Family:   . Frequency of Social Gatherings with Friends and Family:   . Attends Religious Services:   . Active Member of Clubs or Organizations:   . Attends Archivist Meetings:   Marland Kitchen Marital Status:      Family History: The patient's family history includes Depression in her father and mother; Hyperlipidemia in her mother; Hypertension in her mother. ROS:   Please see the history of present illness.    All 14 point review of systems negative except as described per history of present illness.  EKGs/Labs/Other Studies Reviewed:    The following studies were reviewed today:   EKG:  EKG is  ordered today.  The ekg ordered today demonstrates EKG showed normal sinus rhythm normal P interval, poor R wave progression anterior precordium, nonspecific ST segment changes.  Recent Labs: 10/13/2019: ALT 10; BUN 8; Creat 0.63; Hemoglobin 16.8; Platelets 314; Potassium 4.6; Sodium 138; TSH 1.85  Recent Lipid Panel    Component Value Date/Time   CHOL 235 (H) 10/13/2019 1112   TRIG 85 10/13/2019 1112   HDL 62 10/13/2019 1112   CHOLHDL 3.8 10/13/2019 1112   VLDL 16 12/02/2015 1053   LDLCALC 154 (H) 10/13/2019 1112    Physical Exam:    VS:  BP 130/80   Pulse 74    Ht 5\' 7"  (1.702 m)   Wt 150 lb (68 kg)   SpO2 100%   BMI 23.49 kg/m     Wt Readings from Last 3 Encounters:  11/04/19 150 lb (68 kg)  09/22/19 151 lb (68.5 kg)  12/31/18 146 lb (66.2 kg)     GEN:  Well nourished, well developed in no acute distress HEENT: Normal NECK: No JVD; No carotid bruits LYMPHATICS: No lymphadenopathy CARDIAC: RRR, no murmurs, no rubs, no gallops RESPIRATORY:  Clear to auscultation without rales, wheezing or rhonchi  ABDOMEN: Soft, non-tender, non-distended MUSCULOSKELETAL:  No edema;  No deformity  SKIN: Warm and dry NEUROLOGIC:  Alert and oriented x 3 PSYCHIATRIC:  Normal affect   ASSESSMENT:    1. Essential hypertension   2. Palpitations   3. Dyspnea on exertion   4. Anxiety    PLAN:    In order of problems listed above:  1. Essential hypertension.  Blood pressure appears to be well controlled today.  Continue with hydrochlorothiazide as well as metoprolol. 2. Palpitations: We will put monitor on her for a week to see exactly what kind of arrhythmia with dealing with she did wear monitor before however it was unsuccessful.  Hopefully this time will succeed in assessing arrhythmia. 3. Dyspnea on exertion: Echocardiogram will be done to assess left ventricle ejection fraction. 4. Anxiety did be followed by internal medicine team. 5. Abnormal EKG raising suspicion for anterior septal wall myocardial infarction.  I doubt she had that we will do echocardiogram to assess that. 6. In the meantime we will continue present management which include beta-blocker.  When we have better understanding about potential arrhythmia then therapy will be modified.   Medication Adjustments/Labs and Tests Ordered: Current medicines are reviewed at length with the patient today.  Concerns regarding medicines are outlined above.  No orders of the defined types were placed in this encounter.  No orders of the defined types were placed in this  encounter.   Signed, Park Liter, MD, [facility]. 11/04/2019 11:32 AM    [facility]

## 2019-11-04 NOTE — Patient Instructions (Signed)
Medication Instructions:  Your physician recommends that you continue on your current medications as directed. Please refer to the Current Medication list given to you today.  *If you need a refill on your cardiac medications before your next appointment, please call your pharmacy*   Lab Work: None. If you have labs (blood work) drawn today and your tests are completely normal, you will receive your results only by: . MyChart Message (if you have MyChart) OR . A paper copy in the mail If you have any lab test that is abnormal or we need to change your treatment, we will call you to review the results.   Testing/Procedures: A zio monitor was ordered today. It will remain on for 7 days. You will then return monitor and event diary in provided box. It takes 1-2 weeks for report to be downloaded and returned to us. We will call you with the results. If monitor falls off or has orange flashing light, please call Zio for further instructions.   Your physician has requested that you have an echocardiogram. Echocardiography is a painless test that uses sound waves to create images of your heart. It provides your doctor with information about the size and shape of your heart and how well your heart's chambers and valves are working. This procedure takes approximately one hour. There are no restrictions for this procedure.     Follow-Up: At [facility], you and your health needs are our priority.  As part of our continuing mission to provide you with exceptional heart care, we have created designated Provider Care Teams.  These Care Teams include your primary Cardiologist (physician) and Advanced Practice Providers (APPs -  Physician Assistants and Nurse Practitioners) who all work together to provide you with the care you need, when you need it.  We recommend signing up for the patient portal called "MyChart".  Sign up information is provided on this After Visit Summary.  MyChart is used to  connect with patients for Virtual Visits (Telemedicine).  Patients are able to view lab/test results, encounter notes, upcoming appointments, etc.  Non-urgent messages can be sent to your provider as well.   To learn more about what you can do with MyChart, go to [email or web address].com.    Your next appointment:   6 week(s)  The format for your next appointment:   In Person  Provider:   Robert Krasowski, MD   Other Instructions   Echocardiogram An echocardiogram is a procedure that uses painless sound waves (ultrasound) to produce an image of the heart. Images from an echocardiogram can provide important information about:  Signs of coronary artery disease (CAD).  Aneurysm detection. An aneurysm is a weak or damaged part of an artery wall that bulges out from the normal force of blood pumping through the body.  Heart size and shape. Changes in the size or shape of the heart can be associated with certain conditions, including heart failure, aneurysm, and CAD.  Heart muscle function.  Heart valve function.  Signs of a past heart attack.  Fluid buildup around the heart.  Thickening of the heart muscle.  A tumor or infectious growth around the heart valves. Tell a health care provider about:  Any allergies you have.  All medicines you are taking, including vitamins, herbs, eye drops, creams, and over-the-counter medicines.  Any blood disorders you have.  Any surgeries you have had.  Any medical conditions you have.  Whether you are pregnant or may be pregnant. What are the risks? Generally,   Generally, this is a safe procedure. However, problems may occur, including:  Allergic reaction to dye (contrast) that may be used during the procedure. What happens before the procedure? No specific preparation is needed. You may eat and drink normally. What happens during the procedure?   An IV tube may be inserted into one of your veins.  You may receive contrast through this  tube. A contrast is an injection that improves the quality of the pictures from your heart.  A gel will be applied to your chest.  A wand-like tool (transducer) will be moved over your chest. The gel will help to transmit the sound waves from the transducer.  The sound waves will harmlessly bounce off of your heart to allow the heart images to be captured in real-time motion. The images will be recorded on a computer. The procedure may vary among health care providers and hospitals. What happens after the procedure?  You may return to your normal, everyday life, including diet, activities, and medicines, unless your health care provider tells you not to do that. Summary  An echocardiogram is a procedure that uses painless sound waves (ultrasound) to produce an image of the heart.  Images from an echocardiogram can provide important information about the size and shape of your heart, heart muscle function, heart valve function, and fluid buildup around your heart.  You do not need to do anything to prepare before this procedure. You may eat and drink normally.  After the echocardiogram is completed, you may return to your normal, everyday life, unless your health care provider tells you not to do that. This information is not intended to replace advice given to you by your health care provider. Make sure you discuss any questions you have with your health care provider. Document Revised: 09/25/2018 Document Reviewed: 07/07/2016 Elsevier Patient Education  [place].

## 2019-11-09 ENCOUNTER — Telehealth (HOSPITAL_COMMUNITY): Payer: Self-pay | Admitting: Cardiology

## 2019-11-10 ENCOUNTER — Telehealth (INDEPENDENT_AMBULATORY_CARE_PROVIDER_SITE_OTHER): Payer: BC Managed Care – PPO

## 2019-11-10 ENCOUNTER — Ambulatory Visit
Admission: RE | Admit: 2019-11-10 | Discharge: 2019-11-10 | Disposition: A | Discharge status: Home / Self Care | Payer: BC Managed Care – PPO | Source: Ambulatory Visit | Attending: Osteopathic Medicine | Admitting: Osteopathic Medicine

## 2019-11-10 ENCOUNTER — Other Ambulatory Visit: Payer: Self-pay | Admitting: Osteopathic Medicine

## 2019-11-10 ENCOUNTER — Other Ambulatory Visit: Payer: Self-pay

## 2019-11-10 DIAGNOSIS — N6320 Unspecified lump in the left breast, unspecified quadrant: Secondary | ICD-10-CM

## 2019-11-10 DIAGNOSIS — N6489 Other specified disorders of breast: Secondary | ICD-10-CM

## 2019-11-10 DIAGNOSIS — N6312 Unspecified lump in the right breast, upper inner quadrant: Secondary | ICD-10-CM | POA: Diagnosis not present

## 2019-11-10 DIAGNOSIS — R52 Pain, unspecified: Secondary | ICD-10-CM

## 2019-11-10 DIAGNOSIS — N6324 Unspecified lump in the left breast, lower inner quadrant: Secondary | ICD-10-CM | POA: Diagnosis not present

## 2019-11-10 DIAGNOSIS — D242 Benign neoplasm of left breast: Secondary | ICD-10-CM | POA: Diagnosis not present

## 2019-11-10 MED ORDER — TRAMADOL HCL 50 MG PO TABS: 50.0000 mg | ORAL_TABLET | Freq: Three times a day (TID) | ORAL | 0 refills | Status: DC | PRN

## 2019-11-10 MED ORDER — TRAMADOL HCL 50 MG PO TABS: 50.0000 mg | ORAL_TABLET | Freq: Three times a day (TID) | ORAL | 0 refills | Status: AC | PRN

## 2019-11-10 NOTE — Telephone Encounter (Signed)
Can take ibuprofen 800 mg three times per day if needed, try ice as well I sent tramadol, 5 tablets, as needed  5 mins spent by self and staff, billed appropriately

## 2019-11-10 NOTE — Telephone Encounter (Signed)
Attempted to contact patient regarding provider's note and tramadol rx. No answer, phone keeps ringing. No option to leave a vm msg. Will attempt to contact patient next business day.

## 2019-11-10 NOTE — Telephone Encounter (Signed)
Pt called requesting if provider can send in a rx for pain. Per pt, she had four breast biopsies today. The facility that did the procedure advised the pt to contact her primary provider for the pain rx. Pls advise, thanks.

## 2019-11-11 ENCOUNTER — Ambulatory Visit (HOSPITAL_BASED_OUTPATIENT_CLINIC_OR_DEPARTMENT_OTHER): Payer: BC Managed Care – PPO

## 2019-11-11 ENCOUNTER — Other Ambulatory Visit: Payer: Self-pay | Admitting: Osteopathic Medicine

## 2019-11-18 ENCOUNTER — Other Ambulatory Visit: Payer: Self-pay | Admitting: Osteopathic Medicine

## 2019-11-18 ENCOUNTER — Other Ambulatory Visit: Payer: Self-pay | Admitting: Surgery

## 2019-11-18 DIAGNOSIS — N6022 Fibroadenosis of left breast: Secondary | ICD-10-CM

## 2019-11-18 DIAGNOSIS — D242 Benign neoplasm of left breast: Secondary | ICD-10-CM

## 2019-11-18 DIAGNOSIS — R110 Nausea: Secondary | ICD-10-CM

## 2019-11-18 DIAGNOSIS — N6489 Other specified disorders of breast: Secondary | ICD-10-CM | POA: Diagnosis not present

## 2019-11-20 ENCOUNTER — Telehealth: Payer: Self-pay

## 2019-11-20 NOTE — Telephone Encounter (Signed)
Patient had tooth pulled  today,says she is in pain and requesting pain medication to be sent to the pharmacy. Please advise

## 2019-11-23 NOTE — Telephone Encounter (Signed)
Dentists can Rx pain meds she should contact the dentist who performed the procedure

## 2019-11-23 NOTE — Telephone Encounter (Signed)
Patient contacted left message with husband, advised per Dr. Sheppard Coil to contact dentist who performed procedure on Friday for medications to be sent in. Husband verbalized his understanding.

## 2019-11-24 NOTE — Telephone Encounter (Signed)
Patient contacted, spoke with husband. Advised per Dr. Sheppard Coil she should contact dentist who performed procedure.

## 2019-11-30 ENCOUNTER — Other Ambulatory Visit: Payer: Self-pay | Admitting: Surgery

## 2019-11-30 DIAGNOSIS — D242 Benign neoplasm of left breast: Secondary | ICD-10-CM

## 2019-11-30 DIAGNOSIS — N6022 Fibroadenosis of left breast: Secondary | ICD-10-CM

## 2019-12-22 ENCOUNTER — Other Ambulatory Visit: Payer: Self-pay | Admitting: Sports Medicine

## 2019-12-22 DIAGNOSIS — G629 Polyneuropathy, unspecified: Secondary | ICD-10-CM

## 2019-12-22 DIAGNOSIS — M791 Myalgia, unspecified site: Secondary | ICD-10-CM

## 2019-12-23 ENCOUNTER — Ambulatory Visit: Payer: BC Managed Care – PPO | Admitting: Cardiology

## 2019-12-23 NOTE — Telephone Encounter (Signed)
I don't believe I've seen her, to PCP

## 2019-12-31 NOTE — Progress Notes (Signed)
Reviewed chart with Dr. Sabra Heck, anesthesiologist at [facility]. Patient will need cardiac clearance prior to having surgery. Hassan Rowan at Dr. Trevor Mace office is aware.

## 2020-01-04 ENCOUNTER — Other Ambulatory Visit: Payer: Self-pay

## 2020-01-04 ENCOUNTER — Telehealth: Payer: Self-pay | Admitting: Cardiology

## 2020-01-04 ENCOUNTER — Encounter (HOSPITAL_BASED_OUTPATIENT_CLINIC_OR_DEPARTMENT_OTHER): Payer: Self-pay | Admitting: Surgery

## 2020-01-04 ENCOUNTER — Other Ambulatory Visit (HOSPITAL_COMMUNITY)
Admission: RE | Admit: 2020-01-04 | Discharge: 2020-01-04 | Disposition: A | Discharge status: Home / Self Care | Payer: BC Managed Care – PPO | Source: Ambulatory Visit | Attending: Surgery | Admitting: Surgery

## 2020-01-04 DIAGNOSIS — Z01812 Encounter for preprocedural laboratory examination: Secondary | ICD-10-CM | POA: Insufficient documentation

## 2020-01-04 DIAGNOSIS — Z20822 Contact with and (suspected) exposure to covid-19: Secondary | ICD-10-CM | POA: Insufficient documentation

## 2020-01-04 LAB — SARS CORONAVIRUS 2 (TAT 6-24 HRS): SARS Coronavirus 2: NEGATIVE

## 2020-01-04 NOTE — Telephone Encounter (Signed)
Dr Agustin Cree we have been asked to grant cardiology clearance for this patient prior to breast lumpectomy (scheduled for 7/21).  You saw her in May for palpitations, dyspnea on exertion, and an abnormal EKG- possible old anterior MI.  You recommended and echo which apparently was never done.  If she is stable symptomatically from a cardiac standpoint can she has her lumpectomy without the echo ? Please respond to CV DIV PRE OP  I tried to contact her today and was unable to leave a message.  Kerin Ransom PA-C 01/04/2020 3:09 PM

## 2020-01-04 NOTE — Telephone Encounter (Signed)
   Primary Cardiologist: No primary care provider on file.  Chart reviewed and patient contacted by phone today as part of pre-operative protocol coverage. Given past medical history and time since last visit, based on ACC/AHA guidelines, Lakashia Collison would be at acceptable risk for the planned procedure without further cardiovascular testing.   OK to hold aspirin 3-5 days pre op if needed.  I will route this recommendation to the requesting party via Epic fax function and remove from pre-op pool.  Please call with questions.  Kerin Ransom, PA-C 01/04/2020, 4:06 PM

## 2020-01-04 NOTE — Telephone Encounter (Signed)
I spoke with the patient- she tells me she has been doing fine, no unusual SOB or chest pain.  She says she went for her echo and the tech wasn't there and it was never rescheduled it.  Her EKG from 11/04/2019 shows anterior septal Q waves but she is asymptomatic. Even if she has had a remote anterior MI she would only be a class 2 risk.   I will go ahead and clear her for this procedure and ask Dr Agustin Cree if he would like to pursue the echo at some point in the future.   Kerin Ransom PA-C 01/04/2020 4:05 PM

## 2020-01-04 NOTE — Progress Notes (Signed)
UTR pt by phone, spoke with husband today at 1305. He states that she is sleeping and he will have her call us back, I gave him my direct number. The pt did go today for covid testing. I let him know that we are unable to leave a VM for her as the VM is not set up.  Also let him know that we are awaiting cardiac clearance. Spoke with Hassan Rowan at Dr Trevor Mace office and let her know the above information. They have sent cardiology a note requesting clearance and have not yet heard back. They will fax it when they have it.

## 2020-01-04 NOTE — Telephone Encounter (Signed)
     [facility] Pre-operative Risk Assessment    HEARTCARE STAFF: - Please ensure there is not already an duplicate clearance open for this procedure. - Under Visit Info/Reason for Call, type in Other and utilize the format Clearance MM/DD/YY or Clearance TBD. Do not use dashes or single digits. - If request is for dental extraction, please clarify the # of teeth to be extracted.  Request for surgical clearance:  1. What type of surgery is being performed? Left breast lumpectomy  2. When is this surgery scheduled? 01/07/20   3. What type of clearance is required (medical clearance vs. Pharmacy clearance to hold med vs. Both)? medical  4. Are there any medications that need to be held prior to surgery and how long? NA  5. Practice name and name of physician performing surgery? Dr Coralie Keens, [facility]  6. What is the office phone number? [phone]   7.   What is the office fax number? [phone]  8.   Anesthesia type (None, local, MAC, general) ? general   Deborah Zhang 01/04/2020, 2:24 PM  _________________________________________________________________   (provider comments below)

## 2020-01-04 NOTE — Telephone Encounter (Signed)
Patient returning call.

## 2020-01-05 NOTE — Telephone Encounter (Signed)
Thank you, if she is truly asymptomatic should be fine for her to proceed with this low risk procedure

## 2020-01-06 ENCOUNTER — Ambulatory Visit
Admission: RE | Admit: 2020-01-06 | Discharge: 2020-01-06 | Disposition: A | Discharge status: Home / Self Care | Payer: BC Managed Care – PPO | Source: Ambulatory Visit | Attending: Surgery | Admitting: Surgery

## 2020-01-06 ENCOUNTER — Other Ambulatory Visit: Payer: Self-pay

## 2020-01-06 ENCOUNTER — Other Ambulatory Visit: Payer: Self-pay | Admitting: Osteopathic Medicine

## 2020-01-06 ENCOUNTER — Encounter (HOSPITAL_BASED_OUTPATIENT_CLINIC_OR_DEPARTMENT_OTHER)
Admission: RE | Admit: 2020-01-06 | Discharge: 2020-01-06 | Disposition: A | Discharge status: Home / Self Care | Payer: BC Managed Care – PPO | Source: Ambulatory Visit | Attending: Surgery | Admitting: Surgery

## 2020-01-06 DIAGNOSIS — I10 Essential (primary) hypertension: Secondary | ICD-10-CM | POA: Diagnosis not present

## 2020-01-06 DIAGNOSIS — F17210 Nicotine dependence, cigarettes, uncomplicated: Secondary | ICD-10-CM | POA: Diagnosis not present

## 2020-01-06 DIAGNOSIS — N6022 Fibroadenosis of left breast: Secondary | ICD-10-CM

## 2020-01-06 DIAGNOSIS — D242 Benign neoplasm of left breast: Secondary | ICD-10-CM

## 2020-01-06 DIAGNOSIS — R928 Other abnormal and inconclusive findings on diagnostic imaging of breast: Secondary | ICD-10-CM | POA: Diagnosis not present

## 2020-01-06 DIAGNOSIS — F419 Anxiety disorder, unspecified: Secondary | ICD-10-CM | POA: Diagnosis not present

## 2020-01-06 LAB — BASIC METABOLIC PANEL
Anion gap: 9 (ref 5–15)
BUN: 5 mg/dL — ABNORMAL LOW (ref 6–20)
CO2: 28 mmol/L (ref 22–32)
Calcium: 9.1 mg/dL (ref 8.9–10.3)
Chloride: 102 mmol/L (ref 98–111)
Creatinine, Ser: 0.66 mg/dL (ref 0.44–1.00)
GFR calc Af Amer: >60 mL/min (ref >60)
GFR calc non Af Amer: >60 mL/min (ref >60)
Glucose, Bld: 128 mg/dL — ABNORMAL HIGH (ref 70–99)
Potassium: 4.4 mmol/L (ref 3.5–5.1)
Sodium: 139 mmol/L (ref 135–145)

## 2020-01-06 MED ORDER — CHLORHEXIDINE GLUCONATE CLOTH 2 % EX PADS: 6.0000 | MEDICATED_PAD | Freq: Once | CUTANEOUS | Status: DC

## 2020-01-06 MED ORDER — ENSURE PRE-SURGERY PO LIQD: 296.0000 mL | Freq: Once | ORAL | Status: DC

## 2020-01-06 NOTE — Progress Notes (Signed)

## 2020-01-06 NOTE — Progress Notes (Signed)
Called patient and left message with her husband to remind patient to come in today for Pre-Op blood work. Pt's husband verbalized understanding and stated he would let pt know.

## 2020-01-06 NOTE — H&P (Signed)
Deborah Zhang  Location: [facility] Patient #: [id number] DOB: August 13, 1970 Married / Language: English / Race: White Female   History of Present Illness The patient is a 49 year old female who presents with a breast mass.  Chief complaint: Left breast mass  This is a 49 year old female who presents problems with her left breast. She had actually gone in for mammography because of a palpable mass in the left breast. She underwent multiple imaging 2 biopsies of the left breast. One was at 3 o'clock position and 1 was at the 8 o'clock position of the left breast. The 3 o'clock position showed a complex sclerosing lesion in the 8 o'clock position started a fibroadenoma. Both were less than a centimeter in size. There are also multiple small cysts. She denies nipple discharge. She reports no clear history of breast cancer in the family although there were suspicious that her grandmother may have had breast cancer. She has no cardiopulmonary issues. She has had no present problems with her breasts. She has had multiple surgeries but no issues regarding general anesthesia.   Past Surgical History Breast Biopsy  Left. Colon Polyp Removal - Colonoscopy  Gallbladder Surgery - Laparoscopic  Hysterectomy (due to cancer) - Partial   Diagnostic Studies History  Colonoscopy  5-10 years ago Mammogram  within last year  Allergies (Chanel Teressa Senter, CMA; No Known Drug Allergie: Allergies Reconciled   Medication History (Chanel Teressa Senter, CMA; Metoprolol Succinate ER (50MG  Tablet ER 24HR, Oral) Active. Promethazine HCl (Oral) Specific strength unknown - Active. Gabapentin (300MG  Tablet, Oral) Active. hydroCHLOROthiazide (12.5MG  Tablet, Oral) Active. Medications Reconciled  Social History Leisure centre manager, CMA Alcohol use  Occasional alcohol use. Caffeine use  Carbonated beverages. No drug use  Tobacco use  Current every day smoker.  Family History (Chanel Teressa Senter,  CMA; Depression  Mother. Hypertension  Father, Mother. Migraine Headache  Mother.  Pregnancy / Birth History Leisure centre manager, [place];  Age at menarche  21 years. Gravida  3 Irregular periods  Maternal age  9-20 Para  2  Other Problems ([place], CMA; Anxiety Disorder  High blood pressure     Review of Systems ([place];  General Not Present- Appetite Loss, Chills, Fatigue, Fever, Night Sweats, Weight Gain and Weight Loss. Skin Not Present- Change in Wart/Mole, Dryness, Hives, Jaundice, New Lesions, Non-Healing Wounds, Rash and Ulcer. HEENT Not Present- Earache, Hearing Loss, Hoarseness, Nose Bleed, Oral Ulcers, Ringing in the Ears, Seasonal Allergies, Sinus Pain, Sore Throat, Visual Disturbances, Wears glasses/contact lenses and Yellow Eyes. Respiratory Not Present- Bloody sputum, Chronic Cough, Difficulty Breathing, Snoring and Wheezing. Breast Present- Breast Mass and Breast Pain. Not Present- Nipple Discharge and Skin Changes. Cardiovascular Present- Palpitations and Rapid Heart Rate. Not Present- Chest Pain, Difficulty Breathing Lying Down, Leg Cramps, Shortness of Breath and Swelling of Extremities. Gastrointestinal Not Present- Abdominal Pain, Bloating, Bloody Stool, Change in Bowel Habits, Chronic diarrhea, Constipation, Difficulty Swallowing, Excessive gas, Gets full quickly at meals, Hemorrhoids, Indigestion, Nausea, Rectal Pain and Vomiting. Female Genitourinary Not Present- Frequency, Nocturia, Painful Urination, Pelvic Pain and Urgency. Musculoskeletal Not Present- Back Pain, Joint Pain, Joint Stiffness, Muscle Pain, Muscle Weakness and Swelling of Extremities. Neurological Not Present- Decreased Memory, Fainting, Headaches, Numbness, Seizures, Tingling, Tremor, Trouble walking and Weakness. Psychiatric Present- Anxiety. Not Present- Bipolar, Change in Sleep Pattern, Depression, Fearful and Frequent crying.  Vitals   Weight: 148.5 lb Height:  67in Body Surface Area: 1.78 m Body Mass Index: 23.26 kg/m  Temp.: 98.73F  Pulse: 101 (Regular)  BP: 132/84(Sitting, Left Arm, Standard)    Physical Exam ([place] Appelhans A. Ninfa Linden MD;  The physical exam findings are as follows: Note: She is well appearance  Breast exam bilaterally. There is a very small mass at 3 o'clock position as well as 6 clock position adjacent to the areola of the left breast. There are no right breast masses. There is no axillary adenopathy. The small masses could be the small hematomas from the biopsies. She does have moderate ecchymosis from the biopsies.    Assessment & Plan   COMPLEX SCLEROSING LESION OF LEFT BREAST (N64.89)  Impression: I have reviewed the patient's mammograms and ultrasounds as well as pathology results. I gave her a copy of the pathology results. We discussed the diagnosis of the complex sclerosing lesion as well as a fibroadenoma in detail. Removal of the common sclerosing lesion is recommended. She would also like a fibroadenoma removed as well which I feel is reasonable given her overall concern for breast cancer. I discussed proceeding with a radioactive seed guided left breast lumpectomy 2. I discussed the surgical procedure in detail. I discussed the risk which includes but is not limited to bleeding, infection, injury to surrounding structures, the need for further surgery if malignancy is found, cardiopulmonary issues, postoperative recovery, etc. After long discussion, she wished to proceed with surgery which will be scheduled. This patient encounter took 45 minutes today to perform the following: take history, perform exam, review outside records, interpret imaging, counsel the patient on their diagnosis and document encounter, findings & plan in the [place] (D24.2)

## 2020-01-07 ENCOUNTER — Ambulatory Visit (HOSPITAL_BASED_OUTPATIENT_CLINIC_OR_DEPARTMENT_OTHER): Payer: BC Managed Care – PPO | Admitting: Anesthesiology

## 2020-01-07 ENCOUNTER — Ambulatory Visit
Admission: RE | Admit: 2020-01-07 | Discharge: 2020-01-07 | Disposition: A | Discharge status: Home / Self Care | Payer: BC Managed Care – PPO | Source: Ambulatory Visit | Attending: Surgery | Admitting: Surgery

## 2020-01-07 ENCOUNTER — Encounter (HOSPITAL_BASED_OUTPATIENT_CLINIC_OR_DEPARTMENT_OTHER): Payer: Self-pay | Admitting: Surgery

## 2020-01-07 ENCOUNTER — Ambulatory Visit (HOSPITAL_BASED_OUTPATIENT_CLINIC_OR_DEPARTMENT_OTHER)
Admission: RE | Admit: 2020-01-07 | Discharge: 2020-01-07 | Disposition: A | Discharge status: Home / Self Care | Payer: BC Managed Care – PPO | Attending: Surgery | Admitting: Surgery

## 2020-01-07 ENCOUNTER — Encounter (HOSPITAL_BASED_OUTPATIENT_CLINIC_OR_DEPARTMENT_OTHER)
Admission: RE | Disposition: A | Discharge status: Home / Self Care | Payer: Self-pay | Source: Home / Self Care | Attending: Surgery

## 2020-01-07 ENCOUNTER — Telehealth: Payer: Self-pay

## 2020-01-07 DIAGNOSIS — R928 Other abnormal and inconclusive findings on diagnostic imaging of breast: Secondary | ICD-10-CM | POA: Diagnosis not present

## 2020-01-07 DIAGNOSIS — I10 Essential (primary) hypertension: Secondary | ICD-10-CM | POA: Insufficient documentation

## 2020-01-07 DIAGNOSIS — D242 Benign neoplasm of left breast: Secondary | ICD-10-CM

## 2020-01-07 DIAGNOSIS — F419 Anxiety disorder, unspecified: Secondary | ICD-10-CM | POA: Diagnosis not present

## 2020-01-07 DIAGNOSIS — N6022 Fibroadenosis of left breast: Secondary | ICD-10-CM

## 2020-01-07 DIAGNOSIS — F17210 Nicotine dependence, cigarettes, uncomplicated: Secondary | ICD-10-CM | POA: Insufficient documentation

## 2020-01-07 DIAGNOSIS — N6489 Other specified disorders of breast: Secondary | ICD-10-CM | POA: Diagnosis not present

## 2020-01-07 HISTORY — PX: BREAST LUMPECTOMY WITH RADIOACTIVE SEED LOCALIZATION: SHX6424

## 2020-01-07 SURGERY — BREAST LUMPECTOMY WITH RADIOACTIVE SEED LOCALIZATION
Anesthesia: General | Site: Breast | Laterality: Left

## 2020-01-07 MED ORDER — SCOPOLAMINE 1 MG/3DAYS TD PT72
MEDICATED_PATCH | TRANSDERMAL | Status: AC
  Filled 2020-01-07: qty 1

## 2020-01-07 MED ORDER — FENTANYL CITRATE (PF) 100 MCG/2ML IJ SOLN
INTRAMUSCULAR | Status: DC | PRN
  Administered 2020-01-07 (×2): 25 ug via INTRAVENOUS
  Administered 2020-01-07: 100 ug via INTRAVENOUS
  Administered 2020-01-07: 50 ug via INTRAVENOUS

## 2020-01-07 MED ORDER — ACETAMINOPHEN 500 MG PO TABS
1000.0000 mg | ORAL_TABLET | ORAL | Status: AC
  Administered 2020-01-07: 1000 mg via ORAL

## 2020-01-07 MED ORDER — ONDANSETRON HCL 4 MG/2ML IJ SOLN
INTRAMUSCULAR | Status: AC
  Filled 2020-01-07: qty 2

## 2020-01-07 MED ORDER — DEXAMETHASONE SODIUM PHOSPHATE 10 MG/ML IJ SOLN
INTRAMUSCULAR | Status: AC
  Filled 2020-01-07: qty 1

## 2020-01-07 MED ORDER — OXYCODONE HCL 5 MG/5ML PO SOLN: 5.0000 mg | Freq: Once | ORAL | Status: AC | PRN

## 2020-01-07 MED ORDER — CEFAZOLIN SODIUM-DEXTROSE 2-4 GM/100ML-% IV SOLN
2.0000 g | INTRAVENOUS | Status: AC
  Administered 2020-01-07: 2 g via INTRAVENOUS

## 2020-01-07 MED ORDER — FENTANYL CITRATE (PF) 100 MCG/2ML IJ SOLN
25.0000 ug | INTRAMUSCULAR | Status: DC | PRN
  Administered 2020-01-07 (×4): 25 ug via INTRAVENOUS

## 2020-01-07 MED ORDER — LIDOCAINE 2% (20 MG/ML) 5 ML SYRINGE
INTRAMUSCULAR | Status: AC
  Filled 2020-01-07: qty 5

## 2020-01-07 MED ORDER — MIDAZOLAM HCL 2 MG/2ML IJ SOLN
INTRAMUSCULAR | Status: AC
  Filled 2020-01-07: qty 2

## 2020-01-07 MED ORDER — PROPOFOL 10 MG/ML IV BOLUS
INTRAVENOUS | Status: DC | PRN
  Administered 2020-01-07: 200 mg via INTRAVENOUS

## 2020-01-07 MED ORDER — ONDANSETRON HCL 4 MG/2ML IJ SOLN: 4.0000 mg | Freq: Four times a day (QID) | INTRAMUSCULAR | Status: DC | PRN

## 2020-01-07 MED ORDER — EPHEDRINE 5 MG/ML INJ
INTRAVENOUS | Status: AC
  Filled 2020-01-07: qty 10

## 2020-01-07 MED ORDER — SCOPOLAMINE 1 MG/3DAYS TD PT72
1.0000 | MEDICATED_PATCH | TRANSDERMAL | Status: DC
  Administered 2020-01-07: 1.5 mg via TRANSDERMAL

## 2020-01-07 MED ORDER — EPHEDRINE SULFATE-NACL 50-0.9 MG/10ML-% IV SOSY
PREFILLED_SYRINGE | INTRAVENOUS | Status: DC | PRN
  Administered 2020-01-07: 10 mg via INTRAVENOUS

## 2020-01-07 MED ORDER — GABAPENTIN 300 MG PO CAPS
300.0000 mg | ORAL_CAPSULE | ORAL | Status: AC
  Administered 2020-01-07: 300 mg via ORAL

## 2020-01-07 MED ORDER — OXYCODONE HCL 5 MG PO TABS
5.0000 mg | ORAL_TABLET | Freq: Once | ORAL | Status: AC | PRN
  Administered 2020-01-07: 5 mg via ORAL

## 2020-01-07 MED ORDER — MIDAZOLAM HCL 2 MG/2ML IJ SOLN
INTRAMUSCULAR | Status: DC | PRN
  Administered 2020-01-07: 2 mg via INTRAVENOUS

## 2020-01-07 MED ORDER — OXYCODONE HCL 5 MG PO TABS: 5.0000 mg | ORAL_TABLET | Freq: Four times a day (QID) | ORAL | 0 refills | Status: DC | PRN

## 2020-01-07 MED ORDER — ACETAMINOPHEN 500 MG PO TABS
ORAL_TABLET | ORAL | Status: AC
  Filled 2020-01-07: qty 2

## 2020-01-07 MED ORDER — FENTANYL CITRATE (PF) 100 MCG/2ML IJ SOLN
INTRAMUSCULAR | Status: AC
  Filled 2020-01-07: qty 2

## 2020-01-07 MED ORDER — GABAPENTIN 300 MG PO CAPS
ORAL_CAPSULE | ORAL | Status: AC
  Filled 2020-01-07: qty 1

## 2020-01-07 MED ORDER — LACTATED RINGERS IV SOLN: INTRAVENOUS | Status: DC

## 2020-01-07 MED ORDER — PHENYLEPHRINE 40 MCG/ML (10ML) SYRINGE FOR IV PUSH (FOR BLOOD PRESSURE SUPPORT)
PREFILLED_SYRINGE | INTRAVENOUS | Status: DC | PRN
  Administered 2020-01-07: 120 ug via INTRAVENOUS
  Administered 2020-01-07: 80 ug via INTRAVENOUS

## 2020-01-07 MED ORDER — LIDOCAINE 2% (20 MG/ML) 5 ML SYRINGE
INTRAMUSCULAR | Status: DC | PRN
  Administered 2020-01-07: 40 mg via INTRAVENOUS

## 2020-01-07 MED ORDER — ONDANSETRON HCL 4 MG/2ML IJ SOLN
INTRAMUSCULAR | Status: DC | PRN
  Administered 2020-01-07: 4 mg via INTRAVENOUS

## 2020-01-07 MED ORDER — BUPIVACAINE HCL (PF) 0.25 % IJ SOLN
INTRAMUSCULAR | Status: DC | PRN
  Administered 2020-01-07: 15 mL

## 2020-01-07 MED ORDER — CELECOXIB 200 MG PO CAPS
ORAL_CAPSULE | ORAL | Status: AC
  Filled 2020-01-07: qty 2

## 2020-01-07 MED ORDER — OXYCODONE HCL 5 MG PO TABS
ORAL_TABLET | ORAL | Status: AC
  Filled 2020-01-07: qty 1

## 2020-01-07 MED ORDER — DEXAMETHASONE SODIUM PHOSPHATE 10 MG/ML IJ SOLN
INTRAMUSCULAR | Status: DC | PRN
  Administered 2020-01-07: 4 mg via INTRAVENOUS

## 2020-01-07 MED ORDER — PROPOFOL 10 MG/ML IV BOLUS
INTRAVENOUS | Status: AC
  Filled 2020-01-07: qty 20

## 2020-01-07 MED ORDER — CEFAZOLIN SODIUM-DEXTROSE 2-4 GM/100ML-% IV SOLN
INTRAVENOUS | Status: AC
  Filled 2020-01-07: qty 100

## 2020-01-07 MED ORDER — CELECOXIB 200 MG PO CAPS
400.0000 mg | ORAL_CAPSULE | ORAL | Status: AC
  Administered 2020-01-07: 400 mg via ORAL

## 2020-01-07 SURGICAL SUPPLY — 48 items
ADH SKN CLS APL DERMABOND .7 (GAUZE/BANDAGES/DRESSINGS) ×1
APL PRP STRL LF DISP 70% ISPRP (MISCELLANEOUS) ×1
APPLIER CLIP 9.375 MED OPEN (MISCELLANEOUS)
APR CLP MED 9.3 20 MLT OPN (MISCELLANEOUS)
BINDER BREAST 3XL (GAUZE/BANDAGES/DRESSINGS) IMPLANT
BINDER BREAST LRG (GAUZE/BANDAGES/DRESSINGS) IMPLANT
BINDER BREAST MEDIUM (GAUZE/BANDAGES/DRESSINGS) IMPLANT
BINDER BREAST XLRG (GAUZE/BANDAGES/DRESSINGS) IMPLANT
BINDER BREAST XXLRG (GAUZE/BANDAGES/DRESSINGS) IMPLANT
BLADE SURG 15 STRL LF DISP TIS (BLADE) ×1 IMPLANT
BLADE SURG 15 STRL SS (BLADE) ×2
CANISTER SUC SOCK COL 7IN (MISCELLANEOUS) IMPLANT
CANISTER SUCT 1200ML W/VALVE (MISCELLANEOUS) IMPLANT
CHLORAPREP W/TINT 26 (MISCELLANEOUS) ×2 IMPLANT
CLIP APPLIE 9.375 MED OPEN (MISCELLANEOUS) IMPLANT
COVER BACK TABLE 60X90IN (DRAPES) ×2 IMPLANT
COVER MAYO STAND STRL (DRAPES) ×2 IMPLANT
COVER PROBE W GEL 5X96 (DRAPES) ×2 IMPLANT
COVER WAND RF STERILE (DRAPES) IMPLANT
DECANTER SPIKE VIAL GLASS SM (MISCELLANEOUS) IMPLANT
DERMABOND ADVANCED (GAUZE/BANDAGES/DRESSINGS) ×1
DERMABOND ADVANCED .7 DNX12 (GAUZE/BANDAGES/DRESSINGS) ×1 IMPLANT
DRAPE LAPAROSCOPIC ABDOMINAL (DRAPES) ×2 IMPLANT
DRAPE UTILITY XL STRL (DRAPES) ×2 IMPLANT
ELECT REM PT RETURN 9FT ADLT (ELECTROSURGICAL) ×2
ELECTRODE REM PT RTRN 9FT ADLT (ELECTROSURGICAL) ×1 IMPLANT
GAUZE SPONGE 4X4 12PLY STRL LF (GAUZE/BANDAGES/DRESSINGS) IMPLANT
GLOVE SURG SIGNA 7.5 PF LTX (GLOVE) ×2 IMPLANT
GOWN STRL REUS W/ TWL LRG LVL3 (GOWN DISPOSABLE) ×1 IMPLANT
GOWN STRL REUS W/ TWL XL LVL3 (GOWN DISPOSABLE) ×1 IMPLANT
GOWN STRL REUS W/TWL LRG LVL3 (GOWN DISPOSABLE) ×2
GOWN STRL REUS W/TWL XL LVL3 (GOWN DISPOSABLE) ×2
KIT MARKER MARGIN INK (KITS) ×2 IMPLANT
NEEDLE HYPO 25X1 1.5 SAFETY (NEEDLE) ×2 IMPLANT
NS IRRIG 1000ML POUR BTL (IV SOLUTION) IMPLANT
PACK BASIN DAY SURGERY FS (CUSTOM PROCEDURE TRAY) ×2 IMPLANT
PENCIL SMOKE EVACUATOR (MISCELLANEOUS) ×2 IMPLANT
SLEEVE SCD COMPRESS KNEE MED (MISCELLANEOUS) ×2 IMPLANT
SPONGE LAP 4X18 RFD (DISPOSABLE) ×2 IMPLANT
SUT MNCRL AB 4-0 PS2 18 (SUTURE) ×2 IMPLANT
SUT SILK 2 0 SH (SUTURE) IMPLANT
SUT VIC AB 3-0 SH 27 (SUTURE) ×2
SUT VIC AB 3-0 SH 27X BRD (SUTURE) ×1 IMPLANT
SYR CONTROL 10ML LL (SYRINGE) ×2 IMPLANT
TOWEL GREEN STERILE FF (TOWEL DISPOSABLE) ×2 IMPLANT
TRAY FAXITRON CT DISP (TRAY / TRAY PROCEDURE) ×2 IMPLANT
TUBE CONNECTING 20X1/4 (TUBING) IMPLANT
YANKAUER SUCT BULB TIP NO VENT (SUCTIONS) IMPLANT

## 2020-01-07 NOTE — Anesthesia Preprocedure Evaluation (Signed)
Anesthesia Evaluation  Patient identified by MRN, date of birth, ID band Patient awake    Reviewed: Allergy & Precautions, H&P , NPO status , Patient's Chart, lab work & pertinent test results  Airway Mallampati: II   Neck ROM: full    Dental   Pulmonary Current Smoker,    breath sounds clear to auscultation       Cardiovascular hypertension,  Rhythm:regular Rate:Normal     Neuro/Psych PSYCHIATRIC DISORDERS Anxiety    GI/Hepatic   Endo/Other    Renal/GU      Musculoskeletal   Abdominal   Peds  Hematology   Anesthesia Other Findings   Reproductive/Obstetrics                             Anesthesia Physical Anesthesia Plan  ASA: II  Anesthesia Plan: General   Post-op Pain Management:    Induction: Intravenous  PONV Risk Score and Plan: 2 and Ondansetron, Dexamethasone, Midazolam and Treatment may vary due to age or medical condition  Airway Management Planned: LMA  Additional Equipment:   Intra-op Plan:   Post-operative Plan: Extubation in OR  Informed Consent: I have reviewed the patients History and Physical, chart, labs and discussed the procedure including the risks, benefits and alternatives for the proposed anesthesia with the patient or authorized representative who has indicated his/her understanding and acceptance.       Plan Discussed with: CRNA, Anesthesiologist and Surgeon  Anesthesia Plan Comments:         Anesthesia Quick Evaluation

## 2020-01-07 NOTE — Interval H&P Note (Signed)
History and Physical Interval Note: no change I H and P  01/07/2020 7:04 AM  Deborah Zhang  has presented today for surgery, with the diagnosis of LEFT BREAST COMPLEX SCLEROSING LESION, LEFT BREAST FIBROADENOMA.  The various methods of treatment have been discussed with the patient and family. After consideration of risks, benefits and other options for treatment, the patient has consented to  Procedure(s): LEFT BREAST LUMPECTOMY X 2 WITH RADIOACTIVE SEED LOCALIZATION (Left) as a surgical intervention.  The patient's history has been reviewed, patient examined, no change in status, stable for surgery.  I have reviewed the patient's chart and labs.  Questions were answered to the patient's satisfaction.     Coralie Keens

## 2020-01-07 NOTE — Discharge Instructions (Signed)
[facility] Office Phone Number [phone]  BREAST BIOPSY/ PARTIAL MASTECTOMY: POST OP INSTRUCTIONS  Always review your discharge instruction sheet given to you by the facility where your surgery was performed.  IF YOU HAVE DISABILITY OR FAMILY LEAVE FORMS, YOU MUST BRING THEM TO THE OFFICE FOR PROCESSING.  DO NOT GIVE THEM TO YOUR DOCTOR.  1. A prescription for pain medication may be given to you upon discharge.  Take your pain medication as prescribed, if needed.  If narcotic pain medicine is not needed, then you may take acetaminophen (Tylenol) or ibuprofen (Advil) as needed. 2. Take your usually prescribed medications unless otherwise directed 3. If you need a refill on your pain medication, please contact your pharmacy.  They will contact our office to request authorization.  Prescriptions will not be filled after 5pm or on week-ends. 4. You should eat very light the first 24 hours after surgery, such as soup, crackers, pudding, etc.  Resume your normal diet the day after surgery. 5. Most patients will experience some swelling and bruising in the breast.  Ice packs and a good support bra will help.  Swelling and bruising can take several days to resolve.  6. It is common to experience some constipation if taking pain medication after surgery.  Increasing fluid intake and taking a stool softener will usually help or prevent this problem from occurring.  A mild laxative (Milk of Magnesia or Miralax) should be taken according to package directions if there are no bowel movements after 48 hours. 7. Unless discharge instructions indicate otherwise, you may remove your bandages 24-48 hours after surgery, and you may shower at that time.  You may have steri-strips (small skin tapes) in place directly over the incision.  These strips should be left on the skin for 7-10 days.  If your surgeon used skin glue on the incision, you may shower in 24 hours.  The glue will flake off over the  next 2-3 weeks.  Any sutures or staples will be removed at the office during your follow-up visit. 8. ACTIVITIES:  You may resume regular daily activities (gradually increasing) beginning the next day.  Wearing a good support bra or sports bra minimizes pain and swelling.  You may have sexual intercourse when it is comfortable. a. You may drive when you no longer are taking prescription pain medication, you can comfortably wear a seatbelt, and you can safely maneuver your car and apply brakes. b. RETURN TO WORK:  ______________________________________________________________________________________ 9. You should see your doctor in the office for a follow-up appointment approximately two weeks after your surgery.  Your doctor's nurse will typically make your follow-up appointment when she calls you with your pathology report.  Expect your pathology report 2-3 business days after your surgery.  You may call to check if you do not hear from us after three days. 10. OTHER INSTRUCTIONS:OK TO SHOWER STARTING TOMORROW 11. ICE PACK, TYLENOL, AND IBUPROFEN ALSO FOR PAIN 12. NO VIGOROUS ACTIVITY FOR ONE WEEK _______________________________________________________________________________________________ _____________________________________________________________________________________________________________________________________ _____________________________________________________________________________________________________________________________________ _____________________________________________________________________________________________________________________________________  WHEN TO CALL YOUR DOCTOR: 1. Fever over 101.0 2. Nausea and/or vomiting. 3. Extreme swelling or bruising. 4. Continued bleeding from incision. 5. Increased pain, redness, or drainage from the incision.  The clinic staff is available to answer your questions during regular business hours.  Please don't hesitate to  call and ask to speak to one of the nurses for clinical concerns.  If you have a medical emergency, go to the nearest emergency room or call 911.  A   surgeon from [facility] is always on call at the hospital.  For further questions, please visit [email or web address]   Post Anesthesia Home Care Instructions  Activity: Get plenty of rest for the remainder of the day. A responsible individual must stay with you for 24 hours following the procedure.  For the next 24 hours, DO NOT: -Drive a car -Operate machinery -Drink alcoholic beverages -Take any medication unless instructed by your physician -Make any legal decisions or sign important papers.  Meals: Start with liquid foods such as gelatin or soup. Progress to regular foods as tolerated. Avoid greasy, spicy, heavy foods. If nausea and/or vomiting occur, drink only clear liquids until the nausea and/or vomiting subsides. Call your physician if vomiting continues.  Special Instructions/Symptoms: Your throat may feel dry or sore from the anesthesia or the breathing tube placed in your throat during surgery. If this causes discomfort, gargle with warm salt water. The discomfort should disappear within 24 hours.  If you had a scopolamine patch placed behind your ear for the management of post- operative nausea and/or vomiting:  1. The medication in the patch is effective for 72 hours, after which it should be removed.  Wrap patch in a tissue and discard in the trash. Wash hands thoroughly with soap and water. 2. You may remove the patch earlier than 72 hours if you experience unpleasant side effects which may include dry mouth, dizziness or visual disturbances. 3. Avoid touching the patch. Wash your hands with soap and water after contact with the patch.     

## 2020-01-07 NOTE — Telephone Encounter (Signed)
Patient had breast biopsy done today. During discharge the surgeon staff asked if she changed her metoprolol dosage. Patient says she has not and and neither has the cardiologist. I have looked in the chart to see why they would ask her that, I checked for any prescribing errorsas well as  . Patient just wants to look into it a little further to make sure she is doing what she is suppose to be doing.

## 2020-01-07 NOTE — Transfer of Care (Signed)
Immediate Anesthesia Transfer of Care Note  Patient: Deborah Zhang  Procedure(s) Performed: LEFT BREAST LUMPECTOMY X 2 WITH RADIOACTIVE SEED LOCALIZATION (Left Breast)  Patient Location: PACU  Anesthesia Type:General  Level of Consciousness: awake, alert  and oriented  Airway & Oxygen Therapy: Patient Spontanous Breathing and Patient connected to face mask oxygen  Post-op Assessment: Report given to RN, Post -op Vital signs reviewed and stable and Patient moving all extremities X 4  Post vital signs: Reviewed and stable  Last Vitals:  Vitals Value Taken Time  BP 111/75 01/07/20 0916  Temp    Pulse 84 01/07/20 0918  Resp 15 01/07/20 0918  SpO2 100 % 01/07/20 0918  Vitals shown include unvalidated device data.  Last Pain:  Vitals:   01/07/20 0706  TempSrc: Oral  PainSc: 0-No pain         Complications: No complications documented.

## 2020-01-07 NOTE — Anesthesia Procedure Notes (Signed)
Procedure Name: LMA Insertion Date/Time: 01/07/2020 8:36 AM Performed by: Niel Hummer, CRNA Pre-anesthesia Checklist: Patient identified, Emergency Drugs available, Suction available and Patient being monitored Patient Re-evaluated:Patient Re-evaluated prior to induction Oxygen Delivery Method: Circle system utilized Preoxygenation: Pre-oxygenation with 100% oxygen Induction Type: IV induction Ventilation: Mask ventilation without difficulty LMA: LMA inserted LMA Size: 4.0 Number of attempts: 1 Dental Injury: Teeth and Oropharynx as per pre-operative assessment

## 2020-01-07 NOTE — Op Note (Signed)
LEFT BREAST LUMPECTOMY X 2 WITH RADIOACTIVE SEED LOCALIZATION  Procedure Note  Deborah Zhang 01/07/2020   Pre-op Diagnosis: LEFT BREAST COMPLEX SCLEROSING LESION, LEFT BREAST FIBROADENOMA     Post-op Diagnosis: same  Procedure(s): LEFT BREAST LUMPECTOMY X 2 WITH RADIOACTIVE SEED LOCALIZATION  Surgeon(s): Coralie Keens, MD  Anesthesia: General  Staff:  Circulator: Maurene Capes, RN Scrub Person: Rowe Robert, NT  Estimated Blood Loss: Minimal               Specimens: sent to path  Indications: This is a 49 year old female with 2 abnormalities in left breast on screening mammography.  Both areas were biopsied.  The 8:00 abnormality was a fibroadenoma and a 3:00 abnormality of the left breast was a complex sclerosing lesion.  The decision was made to proceed with a radioactive seed guided lumpectomy x2 to remove both areas for histologic evaluation  Procedure: Patient was brought to the operating room and identifies correct patient.  She is placed upon the operating table general anesthesia was induced.  Her left breast was prepped and draped in usual sterile fashion.  I located both seeds with the neoprobe.  I anesthetized the lower edge of the areola with Marcaine and then made a circumareolar incision with a scalpel.  I first dissected toward the 8 o'clock position with the aid of neoprobe to identify the superficial fibroadenoma.  I excised the fibroadenoma with the cautery including the seed.  Once we have the specimen removed the seed fell out so we placed in a separate medicine cup.  We x-rayed the specimen confirming that the previous tissue marker was in the specimen.  This along with the seed were sent to pathology.  I then dissected toward 3 o'clock position with the aid of neoprobe and identified the area of the complex sclerosing lesion.  I performed a lumpectomy here as well with the cautery excising the area around the radioactive seed.  Once the specimen was  removed I marked all margins with marker paint.  The specimen was then x-rayed confirming the radioactive seed and previous tissue marker were in the specimen.  This was also sent to pathology for evaluation.  I anesthetized the wound for Marcaine.  Hemostasis was achieved with cautery.  I then closed the subcutaneous tissue with interrupted 3-0 Vicryl sutures and closed the skin with a running 4-0 Monocryl.  Dermabond was then applied.  The patient tolerated the procedure well.  All the counts were correct at the end of the procedure.  The patient was then extubated in the operating room and taken in a stable condition to the recovery room.          Coralie Keens   Date: 01/07/2020  Time: 9:10 AM

## 2020-01-08 ENCOUNTER — Encounter (HOSPITAL_BASED_OUTPATIENT_CLINIC_OR_DEPARTMENT_OTHER): Payer: Self-pay | Admitting: Surgery

## 2020-01-08 LAB — SURGICAL PATHOLOGY

## 2020-01-08 NOTE — Telephone Encounter (Signed)
Attempted to contact pt regarding provider's note. No answer, phone keeps ringing. Unable to leave a vm msg.

## 2020-01-08 NOTE — Telephone Encounter (Signed)
Not sure why they would have brought that up On my review of prescriptions, metoprolol has always been the same

## 2020-01-08 NOTE — Anesthesia Postprocedure Evaluation (Signed)
Anesthesia Post Note  Patient: Deborah Zhang  Procedure(s) Performed: LEFT BREAST LUMPECTOMY X 2 WITH RADIOACTIVE SEED LOCALIZATION (Left Breast)     Patient location during evaluation: PACU Anesthesia Type: General Level of consciousness: awake and alert Pain management: pain level controlled Vital Signs Assessment: post-procedure vital signs reviewed and stable Respiratory status: spontaneous breathing, nonlabored ventilation, respiratory function stable and patient connected to nasal cannula oxygen Cardiovascular status: blood pressure returned to baseline and stable Postop Assessment: no apparent nausea or vomiting Anesthetic complications: no   No complications documented.  Last Vitals:  Vitals:   01/07/20 1045 01/07/20 1100  BP: 106/70 102/66  Pulse: 68   Resp: 16   Temp: 37.2 C   SpO2: 98%     Last Pain:  Vitals:   01/07/20 1045  TempSrc:   PainSc: 2                  Gabriell Casimir S

## 2020-01-19 ENCOUNTER — Other Ambulatory Visit: Payer: Self-pay | Admitting: Osteopathic Medicine

## 2020-01-19 DIAGNOSIS — R110 Nausea: Secondary | ICD-10-CM

## 2020-01-25 ENCOUNTER — Other Ambulatory Visit: Payer: Self-pay | Admitting: Osteopathic Medicine

## 2020-01-25 DIAGNOSIS — M791 Myalgia, unspecified site: Secondary | ICD-10-CM

## 2020-01-25 DIAGNOSIS — G629 Polyneuropathy, unspecified: Secondary | ICD-10-CM

## 2020-02-08 ENCOUNTER — Other Ambulatory Visit: Payer: Self-pay | Admitting: Osteopathic Medicine

## 2020-02-15 ENCOUNTER — Other Ambulatory Visit: Payer: Self-pay | Admitting: Nurse Practitioner

## 2020-02-15 ENCOUNTER — Other Ambulatory Visit: Payer: Self-pay | Admitting: Osteopathic Medicine

## 2020-02-15 DIAGNOSIS — R110 Nausea: Secondary | ICD-10-CM

## 2020-02-24 ENCOUNTER — Other Ambulatory Visit: Payer: Self-pay | Admitting: Osteopathic Medicine

## 2020-02-24 DIAGNOSIS — M791 Myalgia, unspecified site: Secondary | ICD-10-CM

## 2020-02-24 DIAGNOSIS — G629 Polyneuropathy, unspecified: Secondary | ICD-10-CM

## 2020-03-10 ENCOUNTER — Other Ambulatory Visit: Payer: Self-pay | Admitting: Osteopathic Medicine

## 2020-03-14 ENCOUNTER — Other Ambulatory Visit: Payer: Self-pay | Admitting: Osteopathic Medicine

## 2020-03-14 DIAGNOSIS — R110 Nausea: Secondary | ICD-10-CM

## 2020-03-22 ENCOUNTER — Other Ambulatory Visit: Payer: Self-pay | Admitting: Nurse Practitioner

## 2020-03-24 ENCOUNTER — Other Ambulatory Visit: Payer: Self-pay | Admitting: Osteopathic Medicine

## 2020-03-24 DIAGNOSIS — G629 Polyneuropathy, unspecified: Secondary | ICD-10-CM

## 2020-03-24 DIAGNOSIS — M791 Myalgia, unspecified site: Secondary | ICD-10-CM

## 2020-03-31 ENCOUNTER — Other Ambulatory Visit: Payer: Self-pay | Admitting: Osteopathic Medicine

## 2020-03-31 DIAGNOSIS — R110 Nausea: Secondary | ICD-10-CM

## 2020-04-08 ENCOUNTER — Other Ambulatory Visit: Payer: Self-pay | Admitting: Osteopathic Medicine

## 2020-04-14 ENCOUNTER — Other Ambulatory Visit: Payer: Self-pay | Admitting: Osteopathic Medicine

## 2020-04-14 DIAGNOSIS — R110 Nausea: Secondary | ICD-10-CM

## 2020-04-19 ENCOUNTER — Other Ambulatory Visit: Payer: Self-pay | Admitting: Nurse Practitioner

## 2020-04-21 ENCOUNTER — Other Ambulatory Visit: Payer: Self-pay | Admitting: Osteopathic Medicine

## 2020-04-21 DIAGNOSIS — M791 Myalgia, unspecified site: Secondary | ICD-10-CM

## 2020-04-21 DIAGNOSIS — G629 Polyneuropathy, unspecified: Secondary | ICD-10-CM

## 2020-05-10 ENCOUNTER — Other Ambulatory Visit: Payer: Self-pay | Admitting: Osteopathic Medicine

## 2020-05-16 ENCOUNTER — Other Ambulatory Visit: Payer: Self-pay | Admitting: Osteopathic Medicine

## 2020-05-16 ENCOUNTER — Other Ambulatory Visit: Payer: Self-pay

## 2020-05-16 ENCOUNTER — Ambulatory Visit
Admission: RE | Admit: 2020-05-16 | Discharge: 2020-05-16 | Disposition: A | Discharge status: Home / Self Care | Payer: BC Managed Care – PPO | Source: Ambulatory Visit | Attending: Osteopathic Medicine | Admitting: Osteopathic Medicine

## 2020-05-16 DIAGNOSIS — N6489 Other specified disorders of breast: Secondary | ICD-10-CM

## 2020-05-16 DIAGNOSIS — R110 Nausea: Secondary | ICD-10-CM

## 2020-05-19 ENCOUNTER — Other Ambulatory Visit: Payer: Self-pay | Admitting: Nurse Practitioner

## 2020-05-23 ENCOUNTER — Other Ambulatory Visit: Payer: Self-pay | Admitting: Osteopathic Medicine

## 2020-05-23 DIAGNOSIS — M791 Myalgia, unspecified site: Secondary | ICD-10-CM

## 2020-05-23 DIAGNOSIS — G629 Polyneuropathy, unspecified: Secondary | ICD-10-CM

## 2020-06-08 ENCOUNTER — Other Ambulatory Visit: Payer: Self-pay | Admitting: Osteopathic Medicine

## 2020-06-10 ENCOUNTER — Other Ambulatory Visit: Payer: Self-pay | Admitting: Nurse Practitioner

## 2020-06-10 MED ORDER — METOPROLOL SUCCINATE ER 50 MG PO TB24: ORAL_TABLET | ORAL | 0 refills | Status: DC

## 2020-06-10 NOTE — Progress Notes (Signed)
Patient notified on call after hours service that she was unable to pick up her metoprolol refill prior to the pharmacy closing for the Christmas holiday. Refill for metoprolol sent to [facility] in [place], [place] for patient to have access to medication over the long weekend.

## 2020-06-13 ENCOUNTER — Other Ambulatory Visit: Payer: Self-pay | Admitting: Osteopathic Medicine

## 2020-06-13 DIAGNOSIS — R110 Nausea: Secondary | ICD-10-CM

## 2020-06-14 ENCOUNTER — Other Ambulatory Visit: Payer: Self-pay | Admitting: Osteopathic Medicine

## 2020-06-14 DIAGNOSIS — R110 Nausea: Secondary | ICD-10-CM

## 2020-06-15 ENCOUNTER — Other Ambulatory Visit: Payer: Self-pay

## 2020-06-15 DIAGNOSIS — R110 Nausea: Secondary | ICD-10-CM

## 2020-06-15 MED ORDER — PROMETHAZINE HCL 25 MG PO TABS: ORAL_TABLET | ORAL | 1 refills | Status: DC

## 2020-06-20 ENCOUNTER — Other Ambulatory Visit: Payer: Self-pay | Admitting: Osteopathic Medicine

## 2020-06-20 DIAGNOSIS — G629 Polyneuropathy, unspecified: Secondary | ICD-10-CM

## 2020-06-20 DIAGNOSIS — M791 Myalgia, unspecified site: Secondary | ICD-10-CM

## 2020-07-05 ENCOUNTER — Other Ambulatory Visit: Payer: Self-pay | Admitting: Osteopathic Medicine

## 2020-07-05 ENCOUNTER — Telehealth: Payer: Self-pay

## 2020-07-05 NOTE — Telephone Encounter (Signed)
Pt left a vm msg stating her husband tested positive for Covid 1 week ago. Per pt, she is now experiencing Covid like symptoms since yesterday. Please contact the pt to schedule a virtual visit with provider or any other available providers.

## 2020-07-06 ENCOUNTER — Encounter: Payer: Self-pay | Admitting: Family Medicine

## 2020-07-06 ENCOUNTER — Telehealth (INDEPENDENT_AMBULATORY_CARE_PROVIDER_SITE_OTHER): Payer: BC Managed Care – PPO | Admitting: Family Medicine

## 2020-07-06 DIAGNOSIS — J019 Acute sinusitis, unspecified: Secondary | ICD-10-CM | POA: Diagnosis not present

## 2020-07-06 DIAGNOSIS — U071 COVID-19: Secondary | ICD-10-CM

## 2020-07-06 MED ORDER — AMOXICILLIN-POT CLAVULANATE 875-125 MG PO TABS: 1.0000 | ORAL_TABLET | Freq: Two times a day (BID) | ORAL | 0 refills | Status: DC

## 2020-07-06 NOTE — Telephone Encounter (Signed)
Patient has been scheduled for a virtual visit. AM °

## 2020-07-06 NOTE — Progress Notes (Signed)
Called pt 2 x no answer or vm.  Called pt again, no answer. 8:28AM   Pt reports that she has had different sxs for the past week, husband tested positive for COVID on 06/28/2020.  She has headaches,body aches,nausea, she vomited a couple of times, fatigue,she states that she lost taste and smell on yesterday.

## 2020-07-06 NOTE — Progress Notes (Signed)
Virtual Visit via Telephone Note  I connected with Deborah Zhang on 07/06/20 at  8:50 AM EST by telephone and verified that I am speaking with the correct person using two identifiers.   I discussed the limitations, risks, security and privacy concerns of performing an evaluation and management service by telephone and the availability of in person appointments. I also discussed with the patient that there may be a patient responsible charge related to this service. The patient expressed understanding and agreed to proceed.  Patient location: at home Provider loccation: In office   Subjective:    CC: COVID 19   HPI: Pt reports that she has had different sxs for the past week, husband tested positive for COVID on 06/28/2020.  She has been sick for approximately 9 days.  She says it started with headaches,body aches,nausea, diarrhea, she vomited a couple of times, fatigue,she states that she lost taste and smell on yesterday. Starting to get severe sinus congestion, continued headaches, and ears feel full .  She feels like her head is in a "fish bowl".  No fevers or chills currently.   Past medical history, Surgical history, Family history not pertinant except as noted below, Social history, Allergies, and medications have been entered into the medical record, reviewed, and corrections made.   Review of Systems: No fevers, chills, night sweats, weight loss, chest pain, or shortness of breath.   Objective:    General: Speaking clearly in complete sentences without any shortness of breath.  Alert and oriented x3.  Normal judgment. No apparent acute distress.    Impression and Recommendations:    COVID-19-discussed that this is mostly viral illness.  Discussed quarantine though she really only needs to quarantine for another day and after that did discuss wearing a mask if she is going out and limiting any kind of exposure to anyone who is high risk etc.  Encouraged her to make sure that  she is hydrating well.  Okay to use over-the-counter cough cold medicines and Tylenol or Motrin if needed.  We also discussed since its been a little over a week and she is experiencing severe sinus congestion that is okay to fill the antibiotic in the next couple of days if she is not starting to feel like she is improving or if she feels worse.  Did encourage her again to give it a couple more days as most of her symptoms I think are consistent with a viral illness.  Augmentin sent to pharmacy to pick up as needed if she is still not improving after the weekend then please let us know    I discussed the assessment and treatment plan with the patient. The patient was provided an opportunity to ask questions and all were answered. The patient agreed with the plan and demonstrated an understanding of the instructions.   The patient was advised to call back or seek an in-person evaluation if the symptoms worsen or if the condition fails to improve as anticipated.  I provided 18 minutes of non-face-to-face time during this encounter.   Beatrice Lecher, MD

## 2020-07-14 ENCOUNTER — Other Ambulatory Visit: Payer: Self-pay | Admitting: Osteopathic Medicine

## 2020-07-14 DIAGNOSIS — M791 Myalgia, unspecified site: Secondary | ICD-10-CM

## 2020-07-14 DIAGNOSIS — G629 Polyneuropathy, unspecified: Secondary | ICD-10-CM

## 2020-07-14 DIAGNOSIS — R110 Nausea: Secondary | ICD-10-CM

## 2020-08-08 ENCOUNTER — Telehealth (INDEPENDENT_AMBULATORY_CARE_PROVIDER_SITE_OTHER): Payer: BC Managed Care – PPO | Admitting: Osteopathic Medicine

## 2020-08-08 VITALS — BP 124/84 | HR 71 | Temp 98.6°F | Ht 67.0 in | Wt 148.0 lb

## 2020-08-08 DIAGNOSIS — I10 Essential (primary) hypertension: Secondary | ICD-10-CM | POA: Diagnosis not present

## 2020-08-08 DIAGNOSIS — G629 Polyneuropathy, unspecified: Secondary | ICD-10-CM | POA: Diagnosis not present

## 2020-08-08 DIAGNOSIS — M791 Myalgia, unspecified site: Secondary | ICD-10-CM | POA: Diagnosis not present

## 2020-08-08 DIAGNOSIS — R110 Nausea: Secondary | ICD-10-CM

## 2020-08-08 MED ORDER — GABAPENTIN 300 MG PO CAPS: ORAL_CAPSULE | ORAL | 1 refills | Status: DC

## 2020-08-08 MED ORDER — HYDROCHLOROTHIAZIDE 12.5 MG PO CAPS: 12.5000 mg | ORAL_CAPSULE | Freq: Every day | ORAL | 1 refills | Status: DC

## 2020-08-08 MED ORDER — PROMETHAZINE HCL 25 MG PO TABS: 25.0000 mg | ORAL_TABLET | Freq: Two times a day (BID) | ORAL | 2 refills | Status: DC | PRN

## 2020-08-08 MED ORDER — METOPROLOL SUCCINATE ER 50 MG PO TB24: ORAL_TABLET | ORAL | 1 refills | Status: DC

## 2020-08-08 NOTE — Progress Notes (Signed)
Patient needs refills, no other issues.

## 2020-08-08 NOTE — Progress Notes (Signed)
Telemedicine Visit via Audio only - telephone (patient preference /  technical difficulty with MyChart video application)  I connected with Deborah Zhang on 08/08/20 at 2:16 PM  by phone or  telemedicine application as noted above  I verified that I am speaking with or regarding  the correct patient using two identifiers.  Participants: Myself, Dr Emeterio Reeve DO Patient: Deborah Zhang Patient proxy if applicable: none Other, if applicable: none  Patient is at home I am in office at [facility]    I discussed the limitations of evaluation and management  by telemedicine and the availability of in person appointments.  The participant(s) above expressed understanding and  agreed to proceed with this appointment via telemedicine.       History of Present Illness: Deborah Zhang is a 50 y.o. female who would like to discuss medication refills  Doing well, no problems, needs refills on medications       Observations/Objective: BP 124/84   Pulse 71   Temp 98.6 F (37 C) (Oral)   Ht 5\' 7"  (1.702 m)   Wt 148 lb (67.1 kg)   BMI 23.18 kg/m  BP Readings from Last 3 Encounters:  08/08/20 124/84  01/07/20 102/66  11/04/19 130/80   Exam: Normal Speech.    Lab and Radiology Results No results found for this or any previous visit (from the past 72 hour(s)). No results found.     Assessment and Plan: 50 y.o. female with The primary encounter diagnosis was Essential hypertension. Diagnoses of Muscle pain, Neuropathy, and Chronic nausea were also pertinent to this visit.  1. Muscle pain 2. Neuropathy Gabapentin helps, taking up to 5 times per day, often less   3. Chronic nausea Phenergan as needed  4. Essential hypertension Metoprolol and HCTZ keeing BP at goal   PDMP not reviewed this encounter. No orders of the defined types were placed in this encounter.  Meds ordered this encounter  Medications  . gabapentin (NEURONTIN) 300 MG  capsule    Sig: TAKE ONE CAPSULE BY MOUTH UP TO 5 TIMES A DAY AS NEEDED FOR NEUROPATHY PAIN    Dispense:  450 capsule    Refill:  1  . metoprolol succinate (TOPROL-XL) 50 MG 24 hr tablet    Sig: Take once daily with or immediately following a meal.    Dispense:  90 tablet    Refill:  1  . hydrochlorothiazide (MICROZIDE) 12.5 MG capsule    Sig: Take 1 capsule (12.5 mg total) by mouth daily.    Dispense:  90 capsule    Refill:  1  . promethazine (PHENERGAN) 25 MG tablet    Sig: Take 1 tablet (25 mg total) by mouth 2 (two) times daily as needed for nausea or vomiting.    Dispense:  60 tablet    Refill:  2   There are no Patient Instructions on file for this visit.  Instructions sent via MyChart.   Follow Up Instructions: Return in about 6 months (around 02/05/2021) for ANNUAL CHECK-UP - SEE [place] SOONER IF NEEDED, IN-OFFICE VISIT.    I discussed the assessment and treatment plan with the patient. The patient was provided an opportunity to ask questions and all were answered. The patient agreed with the plan and demonstrated an understanding of the instructions.   The patient was advised to call back or seek an in-person evaluation if any new concerns, if symptoms worsen or if the condition fails to improve as anticipated.  10  minutes of non-face-to-face time was provided during this encounter.      . . . . . . . . . . . . . Marland Kitchen                   Historical information moved to improve visibility of documentation.  Past Medical History:  Diagnosis Date  . Ankle pain 04/24/2011  . Anxiety 08/04/2015  . Benzodiazepine dependence ([place]) 12/02/2015   History of withdrawal   . Chronic nausea 08/04/2015  . Essential hypertension 12/17/2018  . Generalized anxiety disorder 12/02/2015   Initiate exercise therapy while we taper down on benzodiazepines. Urgent referral to psychiatry.    Marland Kitchen History of colonic polyps 07/08/2016   Pathology benign 06/2016, f/u colonoscopy 5  years, normal random colon biopsies w/ no colitis  . Muscle pain 12/02/2015   Consider changing dose of gabapentin and could increase amitriptyline, we'll discuss this further at next visit   . Neuropathy 08/04/2015   Past Surgical History:  Procedure Laterality Date  . ABDOMINAL HYSTERECTOMY     for Cervical Cancer   . ANKLE SURGERY    . BREAST EXCISIONAL BIOPSY Left 10/2019   x2  . BREAST LUMPECTOMY WITH RADIOACTIVE SEED LOCALIZATION Left 01/07/2020   Procedure: LEFT BREAST LUMPECTOMY X 2 WITH RADIOACTIVE SEED LOCALIZATION;  Surgeon: Coralie Keens, MD;  Location: [place];  Service: General;  Laterality: Left;  . FOOT SURGERY    . GALLBLADDER SURGERY    . LEG SURGERY     Social History   Tobacco Use  . Smoking status: Current Every Day Smoker    Packs/day: 1.00    Years: 20.00    Pack years: 20.00    Types: Cigarettes  . Smokeless tobacco: Never Used  Substance Use Topics  . Alcohol use: Yes    Alcohol/week: 1.0 standard drink    Types: 1 Standard drinks or equivalent per week   family history includes Depression in her father and mother; Hyperlipidemia in her mother; Hypertension in her mother.  Medications: Current Outpatient Medications  Medication Sig Dispense Refill  . gabapentin (NEURONTIN) 300 MG capsule TAKE ONE CAPSULE BY MOUTH UP TO 5 TIMES A DAY AS NEEDED FOR NEUROPATHY PAIN 450 capsule 1  . hydrochlorothiazide (MICROZIDE) 12.5 MG capsule Take 1 capsule (12.5 mg total) by mouth daily. 90 capsule 1  . metoprolol succinate (TOPROL-XL) 50 MG 24 hr tablet Take once daily with or immediately following a meal. 90 tablet 1  . promethazine (PHENERGAN) 25 MG tablet Take 1 tablet (25 mg total) by mouth 2 (two) times daily as needed for nausea or vomiting. 60 tablet 2   No current facility-administered medications for this visit.   No Known Allergies   If phone visit, billing and coding can please add appropriate modifier if needed

## 2020-11-18 ENCOUNTER — Other Ambulatory Visit: Payer: Self-pay | Admitting: Osteopathic Medicine

## 2020-11-18 DIAGNOSIS — M791 Myalgia, unspecified site: Secondary | ICD-10-CM

## 2020-11-18 DIAGNOSIS — G629 Polyneuropathy, unspecified: Secondary | ICD-10-CM

## 2020-12-08 ENCOUNTER — Other Ambulatory Visit: Payer: Self-pay | Admitting: Osteopathic Medicine

## 2020-12-08 DIAGNOSIS — R110 Nausea: Secondary | ICD-10-CM

## 2021-01-04 ENCOUNTER — Encounter (HOSPITAL_COMMUNITY): Payer: Self-pay

## 2021-01-06 ENCOUNTER — Other Ambulatory Visit: Payer: Self-pay | Admitting: Osteopathic Medicine

## 2021-01-06 DIAGNOSIS — R110 Nausea: Secondary | ICD-10-CM

## 2021-01-09 ENCOUNTER — Other Ambulatory Visit: Payer: Self-pay | Admitting: Osteopathic Medicine

## 2021-01-09 DIAGNOSIS — R110 Nausea: Secondary | ICD-10-CM

## 2021-01-17 ENCOUNTER — Other Ambulatory Visit: Payer: Self-pay | Admitting: Osteopathic Medicine

## 2021-01-17 DIAGNOSIS — G629 Polyneuropathy, unspecified: Secondary | ICD-10-CM

## 2021-01-17 DIAGNOSIS — M791 Myalgia, unspecified site: Secondary | ICD-10-CM

## 2021-02-06 ENCOUNTER — Encounter: Payer: Self-pay | Admitting: Osteopathic Medicine

## 2021-02-06 ENCOUNTER — Ambulatory Visit (INDEPENDENT_AMBULATORY_CARE_PROVIDER_SITE_OTHER): Payer: BC Managed Care – PPO | Admitting: Osteopathic Medicine

## 2021-02-06 ENCOUNTER — Other Ambulatory Visit: Payer: Self-pay

## 2021-02-06 VITALS — BP 139/79 | HR 67 | Temp 98.3°F | Wt 147.0 lb

## 2021-02-06 DIAGNOSIS — I10 Essential (primary) hypertension: Secondary | ICD-10-CM | POA: Diagnosis not present

## 2021-02-06 DIAGNOSIS — M791 Myalgia, unspecified site: Secondary | ICD-10-CM | POA: Diagnosis not present

## 2021-02-06 DIAGNOSIS — F418 Other specified anxiety disorders: Secondary | ICD-10-CM

## 2021-02-06 DIAGNOSIS — Z23 Encounter for immunization: Secondary | ICD-10-CM | POA: Diagnosis not present

## 2021-02-06 DIAGNOSIS — G629 Polyneuropathy, unspecified: Secondary | ICD-10-CM

## 2021-02-06 DIAGNOSIS — Z0000 Encounter for general adult medical examination without abnormal findings: Secondary | ICD-10-CM | POA: Diagnosis not present

## 2021-02-06 DIAGNOSIS — R110 Nausea: Secondary | ICD-10-CM

## 2021-02-06 DIAGNOSIS — F17200 Nicotine dependence, unspecified, uncomplicated: Secondary | ICD-10-CM

## 2021-02-06 DIAGNOSIS — D7589 Other specified diseases of blood and blood-forming organs: Secondary | ICD-10-CM

## 2021-02-06 MED ORDER — METOPROLOL SUCCINATE ER 50 MG PO TB24: ORAL_TABLET | ORAL | 3 refills | Status: DC

## 2021-02-06 MED ORDER — PROMETHAZINE HCL 25 MG PO TABS: ORAL_TABLET | ORAL | 3 refills | Status: DC

## 2021-02-06 MED ORDER — GABAPENTIN 300 MG PO CAPS: ORAL_CAPSULE | ORAL | 3 refills | Status: DC

## 2021-02-06 MED ORDER — HYDROCHLOROTHIAZIDE 12.5 MG PO CAPS: 12.5000 mg | ORAL_CAPSULE | Freq: Every day | ORAL | 3 refills | Status: DC

## 2021-02-06 NOTE — Patient Instructions (Addendum)
General Preventive Care Most recent routine screening labs: ordered.  Blood pressure goal 130/80 or less.  Tobacco: don't! Please let me know if you need help quitting! Alcohol: responsible moderation is ok for most adults - if you have concerns about your alcohol intake, please talk to me!  Exercise: as tolerated to reduce risk of cardiovascular disease and diabetes. Strength training will also prevent osteoporosis.  Mental health: if need for mental health care (medicines, counseling, other), or concerns about moods, please let me know!  Sexual / Reproductive health: if need for STD testing, or if concerns with libido/pain problems, please let me know!  Advanced Directive: Living Will and/or Healthcare Power of Attorney recommended for all adults, regardless of age or health.  Vaccines Flu vaccine: for almost everyone, every fall.  Shingles vaccine: after age 36.  Pneumonia vaccines: DONE TODAY, booster after age 21 Tetanus booster: every 10 years due 2030  COVID vaccine: [place] screenings  Colon cancer screening: colonoscopy due age 71.  Mammogram: due 04/2021 Pap: per OBGYN - if they have said no more Paps, then no more Paps!  Lung cancer screening: CT chest every year for those aged 36 to 13 years who have a 20 pack-year smoking history and currently smoke or have quit within the past 15 years  Infection screenings  HIV: recommended screening at least once age 25-65, more often as needed. Gonorrhea/Chlamydia: screening as needed Hepatitis C: recommended once for everyone age 0000000 TB: certain at-risk populations Other Bone Density Test: recommended for women at age 54

## 2021-02-06 NOTE — Progress Notes (Signed)
Deborah Zhang is a 50 y.o. female who presents to  [place] at [facility]  today, 02/06/21, seeking care for the following:  Annual physical  Monitor chronic issues - see below      [place] with other pertinent findings:  The primary encounter diagnosis was Annual physical exam. Diagnoses of Essential hypertension, Neuropathy, Muscle pain, Chronic nausea, Need for pneumococcal vaccination, Tobacco dependence, and Mixed anxiety depressive state, possible PTSD (previous diagnosis) were also pertinent to this visit.   1. Annual physical exam See below Pt reports OBGYN has cleared her form needing Paps in the future Pneumococcal vaccination updated today d/t smoking history  Pt states colonoscopy due at age 43, we need records   2. Essential hypertension BP Readings from Last 3 Encounters:  02/06/21 139/79  08/08/20 124/84  01/07/20 102/66  BP ok, pt measuring it herslf on occasion at home   3. Neuropathy 4. Muscle pain 5. Chronic nausea Refilled meds  6. Need for pneumococcal vaccination D/t smoking hx  7. Tobacco dependence Discussed cessation options, pt will think about this   8. Mixed anxiety depressive state, possible PTSD (previous diagnosis) Discussed treatment/referral options, pt will think about it   Patient Instructions  General Preventive Care Most recent routine screening labs: ordered.  Blood pressure goal 130/80 or less.  Tobacco: don't! Please let me know if you need help quitting! Alcohol: responsible moderation is ok for most adults - if you have concerns about your alcohol intake, please talk to me!  Exercise: as tolerated to reduce risk of cardiovascular disease and diabetes. Strength training will also prevent osteoporosis.  Mental health: if need for mental health care (medicines, counseling, other), or concerns about moods, please let me know!  Sexual / Reproductive health: if need for STD  testing, or if concerns with libido/pain problems, please let me know!  Advanced Directive: Living Will and/or Healthcare Power of Attorney recommended for all adults, regardless of age or health.  Vaccines Flu vaccine: for almost everyone, every fall.  Shingles vaccine: after age 47.  Pneumonia vaccines: DONE TODAY, booster after age 48 Tetanus booster: every 10 years due 2030  COVID vaccine: [place] screenings  Colon cancer screening: colonoscopy due age 30.  Mammogram: due 04/2021 Pap: per OBGYN - if they have said no more Paps, then no more Paps!  Lung cancer screening: CT chest every year for those aged 64 to 38 years who have a 20 pack-year smoking history and currently smoke or have quit within the past 15 years  Infection screenings  HIV: recommended screening at least once age 41-65, more often as needed. Gonorrhea/Chlamydia: screening as needed Hepatitis C: recommended once for everyone age 0000000 TB: certain at-risk populations Other Bone Density Test: recommended for women at age 43  Orders Placed This Encounter  Procedures   Pneumococcal conjugate vaccine 20-valent (Prevnar 20)   CBC   COMPLETE METABOLIC PANEL WITH GFR   Lipid panel   TSH    Meds ordered this encounter  Medications   gabapentin (NEURONTIN) 300 MG capsule    Sig: TAKE ONE CAPSULE BY MOUTH UP TO 5 TIMES A DAY AS NEEDED FOR NEUROPATHY PAIN    Dispense:  150 capsule    Refill:  3   hydrochlorothiazide (MICROZIDE) 12.5 MG capsule    Sig: Take 1 capsule (12.5 mg total) by mouth daily.    Dispense:  90 capsule    Refill:  3  metoprolol succinate (TOPROL-XL) 50 MG 24 hr tablet    Sig: Take 1 TABLET BY MOUTH once daily with or immediately following a meal.    Dispense:  90 tablet    Refill:  3   promethazine (PHENERGAN) 25 MG tablet    Sig: TAKE 1 TABLET BY MOUTH 2 TIMES DAILY AS NEEDED FOR NAUSEA.    Dispense:  60 tablet    Refill:  3     See below for relevant physical  exam findings  See below for recent lab and imaging results reviewed  Medications, allergies, PMH, PSH, SocH, FamH reviewed below    Follow-up instructions: Return for NURSE VISIT BP CHECK 6 MOS, ANNUAL PHYSICAL IN 1 YEAR .                                        Exam:  BP 139/79 (BP Location: Left Arm, Patient Position: Sitting, Cuff Size: Normal)   Pulse 67   Temp 98.3 F (36.8 C) (Oral)   Wt 147 lb 0.6 oz (66.7 kg)   BMI 23.03 kg/m  Constitonal: VS see above. General Appearance: alert, well-developed, well-nourished, NAD Neck: No masses, trachea midline.  Respiratory: Normal respiratory effort. no wheeze, no rhonchi, no rales Cardiovascular: S1/S2 normal, no murmur, no rub/gallop auscultated. RRR.  Musculoskeletal: Gait normal. Symmetric and independent movement of all extremities Abdominal: non-tender, non-distended, no appreciable organomegaly, neg Murphy's, BS [id number] Neurological: Normal balance/coordination. No tremor. Skin: warm, dry, intact.  Psychiatric: Normal judgment/insight. Normal mood and affect. Oriented x3.   Current Meds  Medication Sig   [DISCONTINUED] gabapentin (NEURONTIN) 300 MG capsule TAKE ONE CAPSULE BY MOUTH UP TO 5 TIMES A DAY AS NEEDED FOR NEUROPATHY PAIN   [DISCONTINUED] hydrochlorothiazide (MICROZIDE) 12.5 MG capsule Take 1 capsule (12.5 mg total) by mouth daily.   [DISCONTINUED] metoprolol succinate (TOPROL-XL) 50 MG 24 hr tablet Take 1 TABLET BY MOUTH once daily with or immediately following a meal.   [DISCONTINUED] promethazine (PHENERGAN) 25 MG tablet TAKE 1 TABLET BY MOUTH 2 TIMES DAILY AS NEEDED FOR NAUSEA.    No Known Allergies  Patient Active Problem List   Diagnosis Date Noted   Palpitations 11/04/2019   Dyspnea on exertion 11/04/2019   Essential hypertension 12/17/2018   History of colonic polyps 07/08/2016   Benzodiazepine dependence ([place]) 12/02/2015   Generalized anxiety disorder 12/02/2015    Muscle pain 12/02/2015   Anxiety 08/04/2015   Chronic nausea 08/04/2015   Neuropathy 08/04/2015   Ankle pain 04/24/2011    Family History  Problem Relation Age of Onset   Depression Father    Depression Mother    Hyperlipidemia Mother    Hypertension Mother     Social History   Tobacco Use  Smoking Status Every Day   Packs/day: 1.00   Years: 20.00   Pack years: 20.00   Types: Cigarettes  Smokeless Tobacco Never    Past Surgical History:  Procedure Laterality Date   ABDOMINAL HYSTERECTOMY     for Cervical Cancer    ANKLE SURGERY     BREAST EXCISIONAL BIOPSY Left 10/2019   x2   BREAST LUMPECTOMY WITH RADIOACTIVE SEED LOCALIZATION Left 01/07/2020   Procedure: LEFT BREAST LUMPECTOMY X 2 WITH RADIOACTIVE SEED LOCALIZATION;  Surgeon: Coralie Keens, MD;  Location: [place];  Service: General;  Laterality: Left;   FOOT SURGERY     GALLBLADDER SURGERY  LEG SURGERY      Immunization History  Administered Date(s) Administered   Influenza,inj,Quad PF,6+ Mos 07/18/2016   PNEUMOCOCCAL CONJUGATE-20 02/06/2021   Tdap 11/13/2018    No results found for this or any previous visit (from the past 2160 hour(s)).  No results found.     All questions at time of visit were answered - patient instructed to contact office with any additional concerns or updates. ER/RTC precautions were reviewed with the patient as applicable.   Please note: manual typing as well as voice recognition software may have been used to produce this document - typos may escape review. Please contact Dr. Sheppard Coil for any needed clarifications.

## 2021-02-07 NOTE — Addendum Note (Signed)
Addended by: Maryla Morrow on: 02/07/2021 08:09 AM   Modules accepted: Orders

## 2021-02-12 LAB — METHYLMALONIC ACID, SERUM: Methylmalonic Acid, Quant: 165 nmol/L (ref 87–318)

## 2021-02-12 LAB — HOMOCYSTEINE: Homocysteine: 12.2 umol/L — ABNORMAL HIGH (ref <10.4)

## 2021-02-12 LAB — COMPLETE METABOLIC PANEL WITH GFR
AG Ratio: 1.5 (calc) (ref 1.0–2.5)
ALT: 12 U/L (ref 6–29)
AST: 16 U/L (ref 10–35)
Albumin: 4.3 g/dL (ref 3.6–5.1)
Alkaline phosphatase (APISO): 65 U/L (ref 31–125)
BUN: 7 mg/dL (ref 7–25)
CO2: 28 mmol/L (ref 20–32)
Calcium: 10.2 mg/dL (ref 8.6–10.2)
Chloride: 103 mmol/L (ref 98–110)
Creat: 0.72 mg/dL (ref 0.50–0.99)
Globulin: 2.8 g/dL (calc) (ref 1.9–3.7)
Glucose, Bld: 114 mg/dL — ABNORMAL HIGH (ref 65–99)
Potassium: 4.2 mmol/L (ref 3.5–5.3)
Sodium: 139 mmol/L (ref 135–146)
Total Bilirubin: 0.8 mg/dL (ref 0.2–1.2)
Total Protein: 7.1 g/dL (ref 6.1–8.1)
eGFR: 102 mL/min/{1.73_m2} (ref >=60)

## 2021-02-12 LAB — CBC
HCT: 48.7 % — ABNORMAL HIGH (ref 35.0–45.0)
Hemoglobin: 16.4 g/dL — ABNORMAL HIGH (ref 11.7–15.5)
MCH: 34.5 pg — ABNORMAL HIGH (ref 27.0–33.0)
MCHC: 33.7 g/dL (ref 32.0–36.0)
MCV: 102.3 fL — ABNORMAL HIGH (ref 80.0–100.0)
MPV: 9.6 fL (ref 7.5–12.5)
Platelets: 317 10*3/uL (ref 140–400)
RBC: 4.76 10*6/uL (ref 3.80–5.10)
RDW: 10.7 % — ABNORMAL LOW (ref 11.0–15.0)
WBC: 13.6 10*3/uL — ABNORMAL HIGH (ref 3.8–10.8)

## 2021-02-12 LAB — TSH: TSH: 1.73 mIU/L

## 2021-02-12 LAB — RETICULOCYTES
ABS Retic: 60320 cells/uL (ref 20000–80000)
Retic Ct Pct: 1.3 %

## 2021-02-12 LAB — LIPID PANEL
Cholesterol: 250 mg/dL — ABNORMAL HIGH (ref <200)
HDL: 60 mg/dL (ref >=50)
LDL Cholesterol (Calc): 166 mg/dL (calc) — ABNORMAL HIGH
Non-HDL Cholesterol (Calc): 190 mg/dL (calc) — ABNORMAL HIGH (ref <130)
Total CHOL/HDL Ratio: 4.2 (calc) (ref <5.0)
Triglycerides: 109 mg/dL (ref <150)

## 2021-02-12 LAB — PATHOLOGIST SMEAR REVIEW

## 2021-02-12 LAB — FOLATE: Folate: 22.2 ng/mL

## 2021-03-27 ENCOUNTER — Other Ambulatory Visit: Payer: Self-pay | Admitting: Osteopathic Medicine

## 2021-05-02 ENCOUNTER — Other Ambulatory Visit: Payer: Self-pay | Admitting: Osteopathic Medicine

## 2021-05-12 ENCOUNTER — Other Ambulatory Visit: Payer: Self-pay | Admitting: Osteopathic Medicine

## 2021-06-02 ENCOUNTER — Encounter: Payer: Self-pay | Admitting: Family Medicine

## 2021-06-02 ENCOUNTER — Ambulatory Visit (INDEPENDENT_AMBULATORY_CARE_PROVIDER_SITE_OTHER): Payer: Self-pay | Admitting: Family Medicine

## 2021-06-02 ENCOUNTER — Other Ambulatory Visit: Payer: Self-pay

## 2021-06-02 VITALS — BP 122/83 | HR 71 | Temp 98.4°F | Ht 67.0 in | Wt 153.1 lb

## 2021-06-02 DIAGNOSIS — F419 Anxiety disorder, unspecified: Secondary | ICD-10-CM

## 2021-06-02 DIAGNOSIS — I10 Essential (primary) hypertension: Secondary | ICD-10-CM

## 2021-06-02 MED ORDER — HYDROXYZINE PAMOATE 25 MG PO CAPS: 25.0000 mg | ORAL_CAPSULE | Freq: Three times a day (TID) | ORAL | 0 refills | Status: DC | PRN

## 2021-06-02 MED ORDER — LISINOPRIL 10 MG PO TABS: 10.0000 mg | ORAL_TABLET | Freq: Every day | ORAL | 3 refills | Status: DC

## 2021-06-02 NOTE — Progress Notes (Signed)
Acute Office Visit  Subjective:    Patient ID: Deborah Zhang, female    DOB: [birth year], 50 y.o.   MRN: [id number]  Chief Complaint  Patient presents with   Hypertension    Hypertension  Patient is in today for hospital follow-up.   05/30/21 ED visit at [place]: She was feeling poorly (headaches, dizziness, numbness/tingling to legs/arms) and BP was 161/105. She was started on lisinopril 10 mg daily in addition to her previous medications (HCTZ 12.5 mg daily, metoprolol succinate 50 mg daily).  She was instructed to follow-up with PCP after discharge. She states labs were fine - doesn't have copies.   She has been feeling better overall - except for one day when BP dropped to 102/68 and made her feel bad. She ate some chicken and spinach and then felt better. Otherwise doing well. Home readings have been around 110/70 most days. She denies any chest pain, shortness of breath, headaches, vision changes, edema.       Past Medical History:  Diagnosis Date   Ankle pain 04/24/2011   Anxiety 08/04/2015   Benzodiazepine dependence ([place]) 12/02/2015   History of withdrawal    Chronic nausea 08/04/2015   Essential hypertension 12/17/2018   Generalized anxiety disorder 12/02/2015   Initiate exercise therapy while we taper down on benzodiazepines. Urgent referral to psychiatry.     History of colonic polyps 07/08/2016   Pathology benign 06/2016, f/u colonoscopy 5 years, normal random colon biopsies w/ no colitis   Muscle pain 12/02/2015   Consider changing dose of gabapentin and could increase amitriptyline, we'll discuss this further at next visit    Neuropathy 08/04/2015    Past Surgical History:  Procedure Laterality Date   ABDOMINAL HYSTERECTOMY     for Cervical Cancer    ANKLE SURGERY     BREAST EXCISIONAL BIOPSY Left 10/2019   x2   BREAST LUMPECTOMY WITH RADIOACTIVE SEED LOCALIZATION Left 01/07/2020   Procedure: LEFT BREAST LUMPECTOMY X 2 WITH  RADIOACTIVE SEED LOCALIZATION;  Surgeon: Coralie Keens, MD;  Location: [place];  Service: General;  Laterality: Left;   FOOT SURGERY     GALLBLADDER SURGERY     LEG SURGERY      Family History  Problem Relation Age of Onset   Depression Father    Depression Mother    Hyperlipidemia Mother    Hypertension Mother     Social History   Socioeconomic History   Marital status: Single    Spouse name: Marvel Sapp   Number of children: 2   Years of education: GED   Highest education level: Not on file  Occupational History   Occupation: homemaker  Tobacco Use   Smoking status: Every Day    Packs/day: 1.00    Years: 20.00    Pack years: 20.00    Types: Cigarettes   Smokeless tobacco: Never  Substance and Sexual Activity   Alcohol use: Yes    Alcohol/week: 1.0 standard drink    Types: 1 Standard drinks or equivalent per week   Drug use: No   Sexual activity: Yes    Partners: Male  Other Topics Concern   Not on file  Social History Narrative   Not on file   Social Determinants of Health   Financial Resource Strain: Not on file  Food Insecurity: Not on file  Transportation Needs: Not on file  Physical Activity: Not on file  Stress: Not on file  Social Connections: Not  on file  Intimate Partner Violence: Not on file    Outpatient Medications Prior to Visit  Medication Sig Dispense Refill   gabapentin (NEURONTIN) 300 MG capsule TAKE ONE CAPSULE BY MOUTH UP TO 5 TIMES A DAY AS NEEDED FOR NEUROPATHY PAIN 150 capsule 0   hydrochlorothiazide (MICROZIDE) 12.5 MG capsule Take 1 capsule (12.5 mg total) by mouth daily. 90 capsule 3   metoprolol succinate (TOPROL-XL) 50 MG 24 hr tablet Take 1 TABLET BY MOUTH once daily with or immediately following a meal. 90 tablet 3   promethazine (PHENERGAN) 25 MG tablet TAKE 1 TABLET BY MOUTH 2 TIMES DAILY AS NEEDED FOR NAUSEA. 60 tablet 0   No facility-administered medications prior to visit.    No Known  Allergies  Review of Systems All review of systems negative except what is listed in the HPI     Objective:    Physical Exam Vitals reviewed.  Constitutional:      Appearance: Normal appearance. She is normal weight.  HENT:     Head: Normocephalic and atraumatic.  Cardiovascular:     Rate and Rhythm: Normal rate and regular rhythm.  Pulmonary:     Effort: Pulmonary effort is normal.     Breath sounds: Normal breath sounds.  Skin:    General: Skin is warm and dry.  Neurological:     General: No focal deficit present.     Mental Status: She is alert and oriented to person, place, and time.  Psychiatric:        Mood and Affect: Mood normal.        Behavior: Behavior normal.        Thought Content: Thought content normal.        Judgment: Judgment normal.    BP 122/83 (BP Location: Left Arm, Patient Position: Sitting, Cuff Size: Normal)    Pulse 71    Temp 98.4 F (36.9 C) (Oral)    Ht 5' 7" (1.702 m)    Wt 153 lb 1.3 oz (69.4 kg)    SpO2 99%    BMI 23.98 kg/m  Wt Readings from Last 3 Encounters:  06/02/21 153 lb 1.3 oz (69.4 kg)  02/06/21 147 lb 0.6 oz (66.7 kg)  08/08/20 148 lb (67.1 kg)    Health Maintenance Due  Topic Date Due   HIV Screening  Never done   Hepatitis C Screening  Never done   PAP SMEAR-Modifier  Never done    There are no preventive care reminders to display for this patient.   Lab Results  Component Value Date   TSH 1.73 02/06/2021   Lab Results  Component Value Date   WBC 13.6 (H) 02/06/2021   HGB 16.4 (H) 02/06/2021   HCT 48.7 (H) 02/06/2021   MCV 102.3 (H) 02/06/2021   PLT 317 02/06/2021   Lab Results  Component Value Date   NA 139 02/06/2021   K 4.2 02/06/2021   CO2 28 02/06/2021   GLUCOSE 114 (H) 02/06/2021   BUN 7 02/06/2021   CREATININE 0.72 02/06/2021   BILITOT 0.8 02/06/2021   ALKPHOS 62 12/02/2015   AST 16 02/06/2021   ALT 12 02/06/2021   PROT 7.1 02/06/2021   ALBUMIN 4.3 12/02/2015   CALCIUM 10.2 02/06/2021    ANIONGAP 9 01/06/2020   EGFR 102 02/06/2021   Lab Results  Component Value Date   CHOL 250 (H) 02/06/2021   Lab Results  Component Value Date   HDL 60 02/06/2021   Lab Results  Component Value Date   LDLCALC 166 (H) 02/06/2021   Lab Results  Component Value Date   TRIG 109 02/06/2021   Lab Results  Component Value Date   CHOLHDL 4.2 02/06/2021   Lab Results  Component Value Date   HGBA1C 4.5 10/13/2019       Assessment & Plan:    1. Essential hypertension BP great today. Adding Lisinopril 10 mg to her list. Labs today. Encouraged her to check BP at home. If BP on the low side in the morning she can spread her meds out by 2 hours or so to ensure she doesn't drop herself too low. Continue healthy diet and regular physical activity.  - CBC - COMPLETE METABOLIC PANEL WITH GFR - lisinopril (ZESTRIL) 10 MG tablet; Take 1 tablet (10 mg total) by mouth daily.  Dispense: 30 tablet; Refill: 3  2. Anxiety Requesting restarting PRN hydroxyzine for occasional anxiety. States this has done well for her in the past.  - hydrOXYzine (VISTARIL) 25 MG capsule; Take 1 capsule (25 mg total) by mouth every 8 (eight) hours as needed.  Dispense: 30 capsule; Refill: 0   Follow-up in 6 weeks to establish care with new PCP and follow-up on BP. Please contact office for sooner follow-up if symptoms do not improve or worsen. Seek emergency care if symptoms become severe.  Purcell Nails Olevia Bowens, DNP, FNP-C

## 2021-06-03 LAB — CBC
HCT: 46.1 % — ABNORMAL HIGH (ref 35.0–45.0)
Hemoglobin: 15.7 g/dL — ABNORMAL HIGH (ref 11.7–15.5)
MCH: 34.7 pg — ABNORMAL HIGH (ref 27.0–33.0)
MCHC: 34.1 g/dL (ref 32.0–36.0)
MCV: 101.8 fL — ABNORMAL HIGH (ref 80.0–100.0)
MPV: 9.9 fL (ref 7.5–12.5)
Platelets: 320 10*3/uL (ref 140–400)
RBC: 4.53 10*6/uL (ref 3.80–5.10)
RDW: 10.6 % — ABNORMAL LOW (ref 11.0–15.0)
WBC: 10.3 10*3/uL (ref 3.8–10.8)

## 2021-06-03 LAB — COMPLETE METABOLIC PANEL WITH GFR
AG Ratio: 1.6 (calc) (ref 1.0–2.5)
ALT: 14 U/L (ref 6–29)
AST: 18 U/L (ref 10–35)
Albumin: 4.2 g/dL (ref 3.6–5.1)
Alkaline phosphatase (APISO): 61 U/L (ref 31–125)
BUN: 10 mg/dL (ref 7–25)
CO2: 29 mmol/L (ref 20–32)
Calcium: 9.5 mg/dL (ref 8.6–10.2)
Chloride: 103 mmol/L (ref 98–110)
Creat: 0.55 mg/dL (ref 0.50–0.99)
Globulin: 2.6 g/dL (calc) (ref 1.9–3.7)
Glucose, Bld: 84 mg/dL (ref 65–99)
Potassium: 3.5 mmol/L (ref 3.5–5.3)
Sodium: 141 mmol/L (ref 135–146)
Total Bilirubin: 0.6 mg/dL (ref 0.2–1.2)
Total Protein: 6.8 g/dL (ref 6.1–8.1)
eGFR: 112 mL/min/{1.73_m2} (ref >=60)

## 2021-06-20 ENCOUNTER — Other Ambulatory Visit: Payer: Self-pay | Admitting: Osteopathic Medicine

## 2021-06-20 DIAGNOSIS — F419 Anxiety disorder, unspecified: Secondary | ICD-10-CM

## 2021-06-20 NOTE — Telephone Encounter (Signed)
90 days with 3 refills called in back in August 2022

## 2021-06-20 NOTE — Telephone Encounter (Signed)
PT needs a refill on on her Hydrochlorothiazide to be called in and has an appt to transfer her care on January 30th from Sheppard Coil to [facility]

## 2021-06-21 MED ORDER — HYDROXYZINE PAMOATE 25 MG PO CAPS: 25.0000 mg | ORAL_CAPSULE | Freq: Three times a day (TID) | ORAL | 0 refills | Status: DC | PRN

## 2021-06-21 NOTE — Telephone Encounter (Signed)
Pt was requesting refill of HYDROXYZINE not HCTZ.  Pt states that she was started on this medication to help with anxiety and BP by Caleen Jobs on 06/02/2021.  She states that this has been helping with the anxiety and would like a refill sent to [facility].  Charyl Bigger, CMA

## 2021-06-21 NOTE — Addendum Note (Signed)
Addended by: Fonnie Mu on: 06/21/2021 01:36 PM   Modules accepted: Orders

## 2021-06-21 NOTE — Telephone Encounter (Signed)
Attempted to contact pt.  No answer and VM box has not been set up.  Unable to leave message.  Charyl Bigger, CMA

## 2021-06-21 NOTE — Telephone Encounter (Signed)
Pt informed of RX.  T. Kriss Ishler, CMA 

## 2021-06-28 ENCOUNTER — Other Ambulatory Visit: Payer: Self-pay | Admitting: Osteopathic Medicine

## 2021-07-07 ENCOUNTER — Telehealth: Payer: Self-pay | Admitting: Family Medicine

## 2021-07-07 ENCOUNTER — Other Ambulatory Visit: Payer: Self-pay | Admitting: Family Medicine

## 2021-07-07 DIAGNOSIS — F419 Anxiety disorder, unspecified: Secondary | ICD-10-CM

## 2021-07-07 MED ORDER — HYDROXYZINE PAMOATE 25 MG PO CAPS: 25.0000 mg | ORAL_CAPSULE | Freq: Three times a day (TID) | ORAL | 3 refills | Status: DC | PRN

## 2021-07-07 NOTE — Telephone Encounter (Signed)
Pt is calling for a refill of her Hydroxyzine meds. She already has a transfer of care scheduled for Dr. Zigmund Daniel on 1/30.

## 2021-07-17 ENCOUNTER — Other Ambulatory Visit: Payer: Self-pay

## 2021-07-17 ENCOUNTER — Ambulatory Visit (INDEPENDENT_AMBULATORY_CARE_PROVIDER_SITE_OTHER): Payer: BC Managed Care – PPO | Admitting: Family Medicine

## 2021-07-17 ENCOUNTER — Encounter: Payer: Self-pay | Admitting: Family Medicine

## 2021-07-17 DIAGNOSIS — G629 Polyneuropathy, unspecified: Secondary | ICD-10-CM

## 2021-07-17 DIAGNOSIS — F419 Anxiety disorder, unspecified: Secondary | ICD-10-CM

## 2021-07-17 DIAGNOSIS — F411 Generalized anxiety disorder: Secondary | ICD-10-CM | POA: Diagnosis not present

## 2021-07-17 DIAGNOSIS — I10 Essential (primary) hypertension: Secondary | ICD-10-CM | POA: Diagnosis not present

## 2021-07-17 MED ORDER — HYDROXYZINE PAMOATE 25 MG PO CAPS: 25.0000 mg | ORAL_CAPSULE | Freq: Three times a day (TID) | ORAL | 6 refills | Status: DC | PRN

## 2021-07-17 MED ORDER — GABAPENTIN 300 MG PO CAPS: ORAL_CAPSULE | ORAL | 9 refills | Status: DC

## 2021-07-17 NOTE — Progress Notes (Signed)
Deborah Zhang - 51 y.o. female MRN [id number]  Date of birth: February 19, 1971  Subjective Chief Complaint  Patient presents with   Hypertension    HPI Deborah Zhang is a 51 year old female here today for for follow-up visit.  She is transferring care from Dr. Sheppard Coil.  She has history of hypertension, chronic neuropathy and anxiety.  Reports she is doing well.  She has been able to discontinue benzodiazepine and feels like hydroxyzine works quite well for her.  She is requesting a refill on this medication.  Her blood pressures remain well controlled combination of lisinopril, hydrochlorothiazide and Toprol.  She is tolerating these well without side effects.  She denies chest pain, shortness of breath, palpitations, headache or vision changes.  Unfortunately she does continue to smoke and does not desire to quit at this time.  Neuropathy remains well controlled with current dose of gabapentin.  ROS:  A comprehensive ROS was completed and negative except as noted per HPI  No Known Allergies  Past Medical History:  Diagnosis Date   Ankle pain 04/24/2011   Anxiety 08/04/2015   Benzodiazepine dependence ([place]) 12/02/2015   History of withdrawal    Chronic nausea 08/04/2015   Essential hypertension 12/17/2018   Generalized anxiety disorder 12/02/2015   Initiate exercise therapy while we taper down on benzodiazepines. Urgent referral to psychiatry.     History of colonic polyps 07/08/2016   Pathology benign 06/2016, f/u colonoscopy 5 years, normal random colon biopsies w/ no colitis   Muscle pain 12/02/2015   Consider changing dose of gabapentin and could increase amitriptyline, we'll discuss this further at next visit    Neuropathy 08/04/2015    Past Surgical History:  Procedure Laterality Date   ANKLE SURGERY     BREAST EXCISIONAL BIOPSY Left 10/2019   x2   BREAST LUMPECTOMY WITH RADIOACTIVE SEED LOCALIZATION Left 01/07/2020   Procedure: LEFT BREAST LUMPECTOMY X 2 WITH RADIOACTIVE SEED LOCALIZATION;   Surgeon: Coralie Keens, MD;  Location: [place];  Service: General;  Laterality: Left;   FOOT SURGERY     GALLBLADDER SURGERY     LEG SURGERY     TOTAL ABDOMINAL HYSTERECTOMY      Social History   Socioeconomic History   Marital status: Single    Spouse name: Blannie Shedlock   Number of children: 2   Years of education: GED   Highest education level: Not on file  Occupational History   Occupation: homemaker  Tobacco Use   Smoking status: Every Day    Packs/day: 1.00    Years: 20.00    Pack years: 20.00    Types: Cigarettes   Smokeless tobacco: Never  Substance and Sexual Activity   Alcohol use: Yes    Alcohol/week: 1.0 standard drink    Types: 1 Standard drinks or equivalent per week   Drug use: No   Sexual activity: Yes    Partners: Male  Other Topics Concern   Not on file  Social History Narrative   Not on file   Social Determinants of Health   Financial Resource Strain: Not on file  Food Insecurity: Not on file  Transportation Needs: Not on file  Physical Activity: Not on file  Stress: Not on file  Social Connections: Not on file    Family History  Problem Relation Age of Onset   Depression Father    Depression Mother    Hyperlipidemia Mother    Hypertension Mother     Health Maintenance  Topic Date Due  HIV Screening  Never done   Hepatitis C Screening  Never done   INFLUENZA VACCINE  09/15/2021 (Originally 01/16/2021)   COLONOSCOPY (Pts 45-33yrs Insurance coverage will need to be confirmed)  08/08/2026   TETANUS/TDAP  11/12/2028   HPV VACCINES  Aged Out   PAP SMEAR-Modifier  Discontinued   COVID-19 Vaccine  Discontinued     ----------------------------------------------------------------------------------------------------------------------------------------------------------------------------------------------------------------- Physical Exam BP 123/69 (BP Location: Left Arm, Patient Position: Sitting, Cuff Size: Normal)     Pulse 79    Temp 97.7 F (36.5 C)    Ht 5\' 7"  (1.702 m)    Wt 161 lb (73 kg)    SpO2 98%    BMI 25.22 kg/m   Physical Exam Constitutional:      Appearance: Normal appearance.  Eyes:     General: No scleral icterus. Cardiovascular:     Rate and Rhythm: Normal rate and regular rhythm.  Pulmonary:     Effort: Pulmonary effort is normal.     Breath sounds: Normal breath sounds.  Musculoskeletal:     Cervical back: Neck supple.  Neurological:     Mental Status: She is alert.  Psychiatric:        Mood and Affect: Mood normal.        Behavior: Behavior normal.    ------------------------------------------------------------------------------------------------------------------------------------------------------------------------------------------------------------------- Assessment and Plan  Essential hypertension Blood pressure stable at this time.  Continue current medications for management of hypertension.  Neuropathy (HCC) Neuropathy stable with gabapentin at current strength.  Continue.  Generalized anxiety disorder She has tapered off of benzodiazepines.  Doing well with Vistaril at this time.  We will plan to continue this at current strength.  Prescription renewed.   Meds ordered this encounter  Medications   gabapentin (NEURONTIN) 300 MG capsule    Sig: Take 1 capsule 5x per day as needed for neuropathy.    Dispense:  150 capsule    Refill:  9   hydrOXYzine (VISTARIL) 25 MG capsule    Sig: Take 1 capsule (25 mg total) by mouth every 8 (eight) hours as needed.    Dispense:  60 capsule    Refill:  6    Return in about 6 months (around 01/14/2022) for HTN.    This visit occurred during the SARS-CoV-2 public health emergency.  Safety protocols were in place, including screening questions prior to the visit, additional usage of staff PPE, and extensive cleaning of exam room while observing appropriate contact time as indicated for disinfecting solutions.

## 2021-07-17 NOTE — Assessment & Plan Note (Signed)
Neuropathy stable with gabapentin at current strength.  Continue.

## 2021-07-17 NOTE — Assessment & Plan Note (Signed)
Blood pressure stable at this time.  Continue current medications for management of hypertension.

## 2021-07-17 NOTE — Assessment & Plan Note (Signed)
She has tapered off of benzodiazepines.  Doing well with Vistaril at this time.  We will plan to continue this at current strength.  Prescription renewed.

## 2021-08-03 ENCOUNTER — Telehealth: Payer: Self-pay | Admitting: Family Medicine

## 2021-08-03 DIAGNOSIS — R110 Nausea: Secondary | ICD-10-CM

## 2021-08-03 MED ORDER — PROMETHAZINE HCL 25 MG PO TABS: ORAL_TABLET | ORAL | 0 refills | Status: DC

## 2021-08-03 NOTE — Telephone Encounter (Signed)
Pt called for a refill of promethazine.

## 2021-08-03 NOTE — Addendum Note (Signed)
Addended by: Peggye Ley on: 08/03/2021 03:56 PM   Modules accepted: Orders

## 2021-08-24 IMAGING — MG MM PLC BREAST LOC DEV 1ST LESION INC MAMMO GUIDE*L*
8 of 10 series · 8 of 10 positions shown · non-contrast
Comparison: Previous exam(s).

CLINICAL DATA: 41-year-old female for radioactive seed localization
of OUTER LEFT breast KONADU and INNER LEFT breast fibroadenoma prior to
breast excisions.

EXAM:
MAMMOGRAPHIC GUIDED RADIOACTIVE SEED LOCALIZATION OF THE LEFT BREAST
X 2

[L LM (1 of 4)]
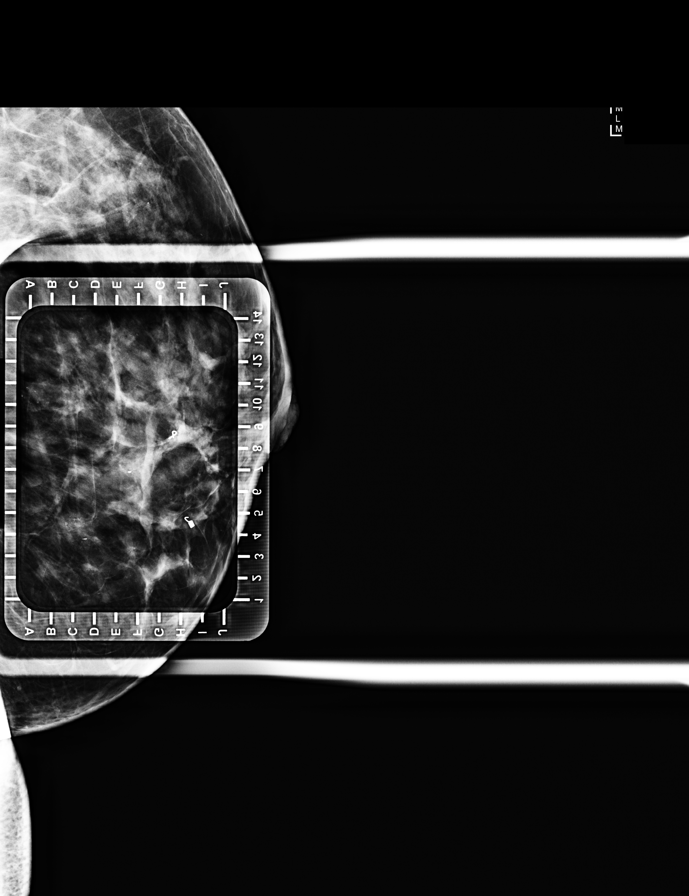

[L LM (2 of 4)]
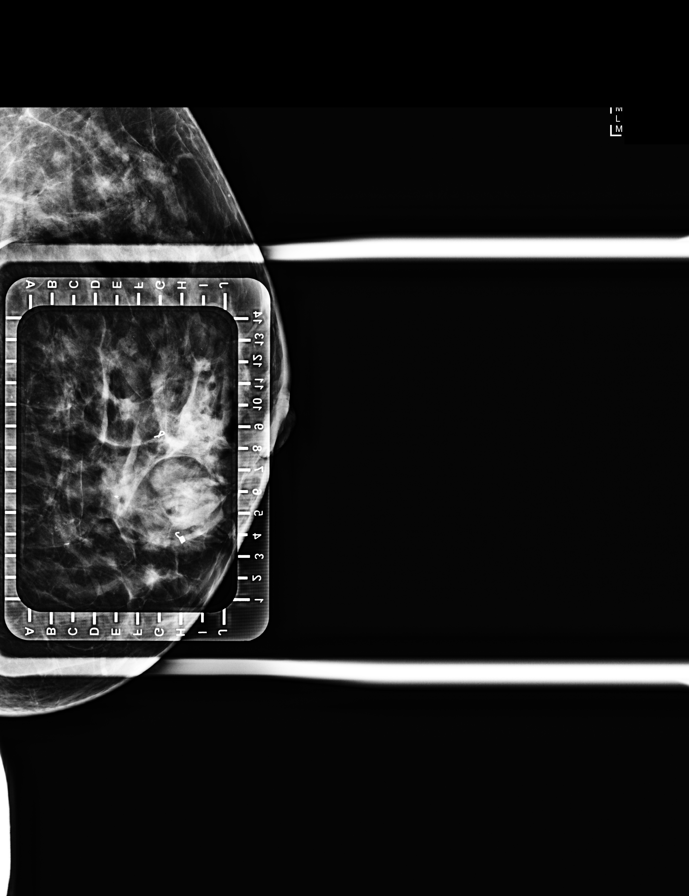

[L CC (1 of 4)]
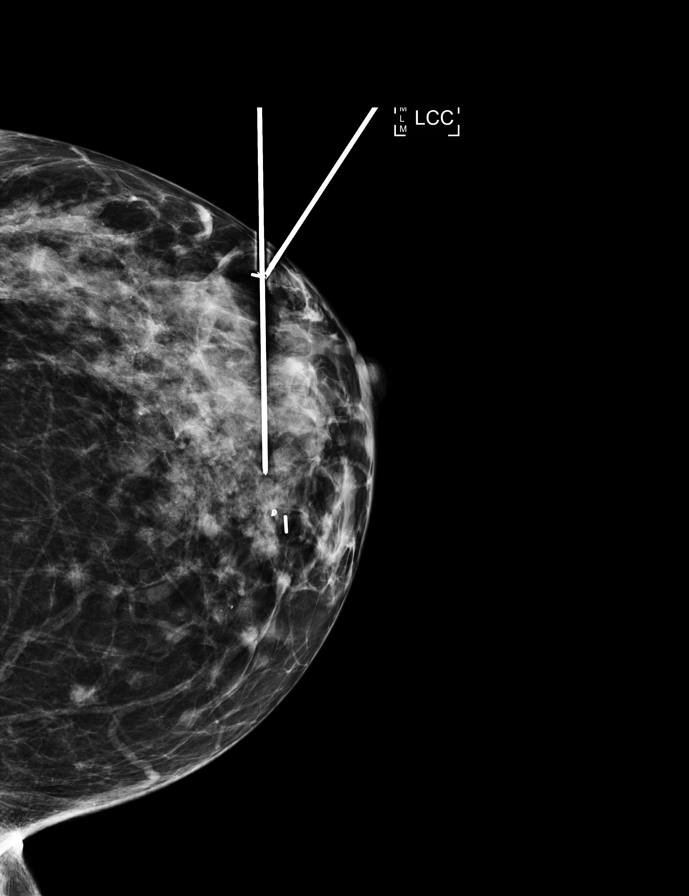

[L CC (2 of 4)]
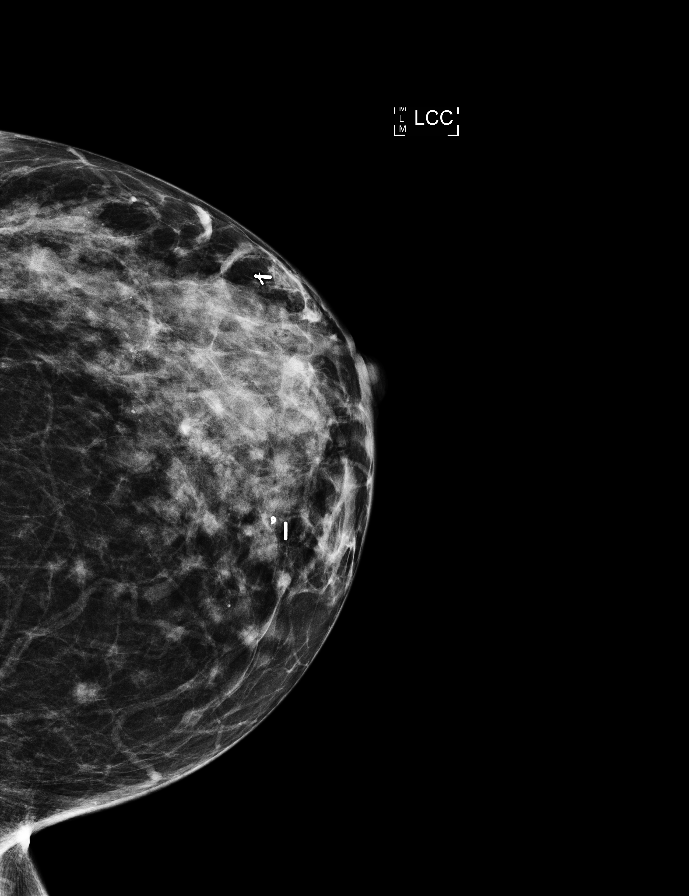

[L CC (3 of 4)]
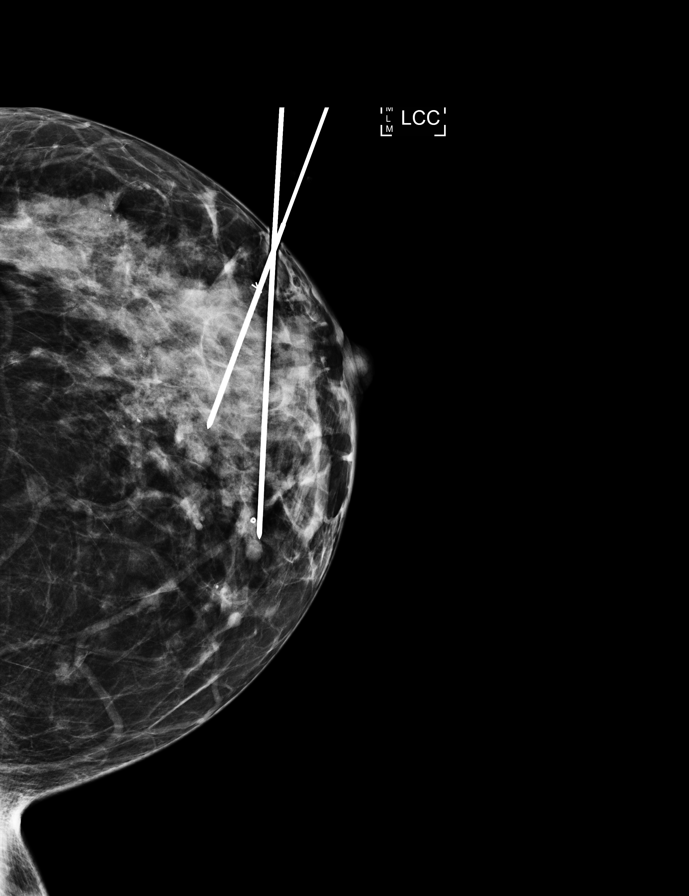

[L CC (4 of 4)]
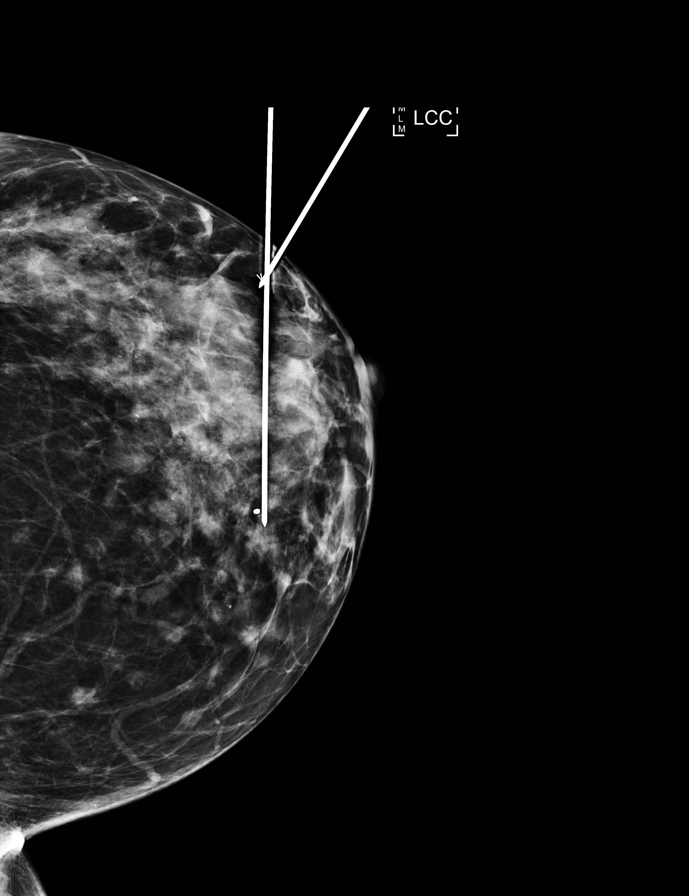

[L LM (3 of 4)]
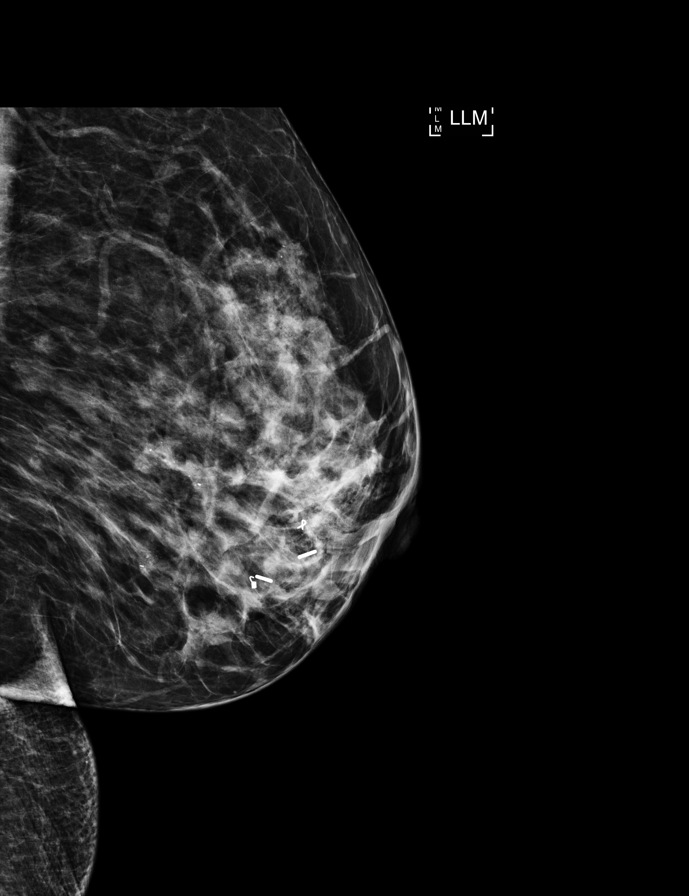

[L LM (4 of 4)]
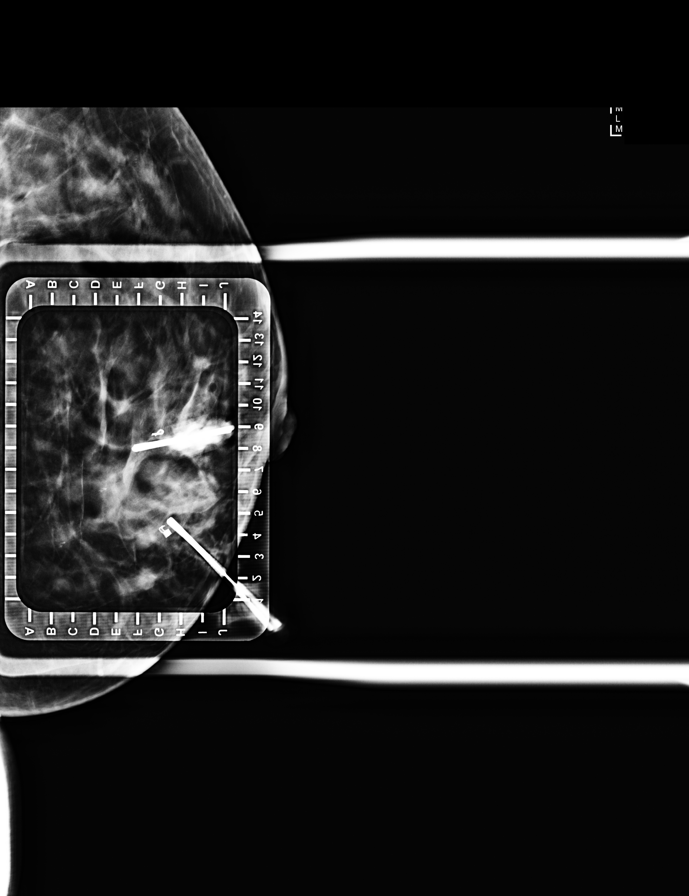

[8 of 10 positions shown; findings below may reference images not displayed]

FINDINGS: Patient presents for radioactive seed localization prior to LEFT
breast excisions. I met with the patient and we discussed the
procedure of seed localization including benefits and alternatives.
We discussed the high likelihood of a successful procedures. We
discussed the risks of the procedures including infection, bleeding,
tissue injury and further surgery. We discussed the low dose of
radioactivity involved in the procedures. Informed, written consent
was given.

The usual time-out protocol was performed immediately prior to the
procedures.

MAMMOGRAPHIC GUIDED RADIOACTIVE SEED LOCALIZATION OF THE LEFT BREAST
#1 (RIBBON Forgidza)

Using mammographic guidance, sterile technique, 1% lidocaine and an
I-V8C radioactive seed, the RIBBON clip was localized using a
LATERAL approach. The follow-up mammogram images confirm the seed in
the expected location and were marked for Dr. Humbert.

Follow-up survey of the patient confirms presence of the radioactive
seed, which is located 0.5 cm INFERIOR to the RIBBON clip.

Order number of I-V8C seed:  [id number].

Total activity:  0.253 millicuries.  Reference Date: 12/24/2019.

MAMMOGRAPHIC GUIDED RADIOACTIVE SEED LOCALIZATION OF THE LEFT BREAST
#2 (COIL clip-fibroadenoma):

Using mammographic guidance, sterile technique, 1% lidocaine and an
I-V8C radioactive seed, the COIL clip was localized using a LATERAL
approach. The follow-up mammogram images confirm the seed in the
expected location and were marked for Dr. Humbert.

Follow-up survey of the patient confirms presence of the radioactive
seed.

Order number of I-V8C seed:  [id number].

Total activity:  0.247 millicuries.  Reference Date: 11/12/2019.

The patient tolerated the procedures well and was released from the
[REDACTED]. She was given instructions regarding seed removal.
IMPRESSION: Radioactive seed localization of 2 separate areas within the LEFT
breast. No apparent complications.

## 2021-08-31 ENCOUNTER — Telehealth: Payer: Self-pay | Admitting: Family Medicine

## 2021-08-31 NOTE — Telephone Encounter (Signed)
Medication is meant to be used as needed, not on a scheduled basis.  I have only seen her the one time in January.  Has she had GI evaluation of her chronic nausea?

## 2021-08-31 NOTE — Telephone Encounter (Signed)
Pt called in at [phone] to request refill on promethazine. Pharmacy on file is correct. Pt does not understand why she must call office every month for refill on just this one medication. She says she receives refills on other meds without calling into office each month. ?

## 2021-09-01 NOTE — Telephone Encounter (Signed)
Spoke with pt who states that she has not seen GI in quite some time.  She states that she has had chronic nausea most of her life and has been on this medication for many years on a routine basis.  Please advise.  Charyl Bigger, CMA ?

## 2021-09-01 NOTE — Telephone Encounter (Signed)
Pt called again about getting a refill on her Promethazine..  She said nobody returned her call. I told her that the Dr's assistant  did call her but she did not have her vm set up. I gave her Dr. Melida Gimenez message and offered her an appointment to discuss her concern with him. She will call us back to let us know when she can get a ride here.  She still wants a return call. She's been on the Pomethazine for awhile and does not understand why Dr. Zigmund Daniel would not refill it. ?

## 2021-09-01 NOTE — Telephone Encounter (Signed)
Attempted to contact pt.  No answer and VM box has not been set up.  Unable to leave a message.  Charyl Bigger, CMA ?

## 2021-09-04 ENCOUNTER — Other Ambulatory Visit: Payer: Self-pay

## 2021-09-04 DIAGNOSIS — R110 Nausea: Secondary | ICD-10-CM

## 2021-09-04 MED ORDER — PROMETHAZINE HCL 25 MG PO TABS: ORAL_TABLET | ORAL | 0 refills | Status: DC

## 2021-09-05 NOTE — Telephone Encounter (Signed)
Medication was sent in for patient on 09/04/21. ?

## 2021-10-03 ENCOUNTER — Telehealth: Payer: Self-pay | Admitting: Family Medicine

## 2021-10-03 DIAGNOSIS — R110 Nausea: Secondary | ICD-10-CM

## 2021-10-03 MED ORDER — PROMETHAZINE HCL 25 MG PO TABS: ORAL_TABLET | ORAL | 3 refills | Status: DC

## 2021-10-03 NOTE — Telephone Encounter (Signed)
Patient called at 1:45 to get a refill on her promethazine. Patient as why does she have to call every month to get a refill. Patient next appt is on 01/12/22. Please advise. ?

## 2021-10-04 NOTE — Telephone Encounter (Signed)
Called patient at 9:42 to make aware Rx renewed. Patient did not answer & voicemail not setup. ?

## 2021-10-26 ENCOUNTER — Other Ambulatory Visit: Payer: Self-pay | Admitting: Family Medicine

## 2021-10-26 ENCOUNTER — Telehealth: Payer: Self-pay | Admitting: Family Medicine

## 2021-10-26 DIAGNOSIS — I10 Essential (primary) hypertension: Secondary | ICD-10-CM

## 2021-10-26 MED ORDER — LISINOPRIL 10 MG PO TABS: 10.0000 mg | ORAL_TABLET | Freq: Every day | ORAL | 1 refills | Status: DC

## 2021-10-26 NOTE — Telephone Encounter (Signed)
Patient called very upset about why she has to keep calling every month to get a refill request completed. The medicine she is calling about this time is lisinopril.  ?

## 2021-10-27 NOTE — Telephone Encounter (Signed)
Not able to leave the patient a message because she does not have a VM set up. 3 calls attempted. ?

## 2021-11-09 ENCOUNTER — Ambulatory Visit: Payer: BC Managed Care – PPO | Admitting: Medical-Surgical

## 2021-11-18 ENCOUNTER — Other Ambulatory Visit: Payer: Self-pay | Admitting: Osteopathic Medicine

## 2022-01-12 ENCOUNTER — Ambulatory Visit: Payer: BC Managed Care – PPO | Admitting: Family Medicine

## 2022-01-12 ENCOUNTER — Encounter: Payer: Self-pay | Admitting: Family Medicine

## 2022-01-12 VITALS — BP 114/74 | HR 75 | Ht 67.0 in | Wt 165.0 lb

## 2022-01-12 DIAGNOSIS — G629 Polyneuropathy, unspecified: Secondary | ICD-10-CM

## 2022-01-12 DIAGNOSIS — F411 Generalized anxiety disorder: Secondary | ICD-10-CM

## 2022-01-12 DIAGNOSIS — I10 Essential (primary) hypertension: Secondary | ICD-10-CM | POA: Diagnosis not present

## 2022-01-12 DIAGNOSIS — F419 Anxiety disorder, unspecified: Secondary | ICD-10-CM

## 2022-01-12 DIAGNOSIS — Z13220 Encounter for screening for lipoid disorders: Secondary | ICD-10-CM

## 2022-01-12 DIAGNOSIS — R110 Nausea: Secondary | ICD-10-CM

## 2022-01-12 MED ORDER — HYDROXYZINE PAMOATE 25 MG PO CAPS: 25.0000 mg | ORAL_CAPSULE | Freq: Three times a day (TID) | ORAL | 6 refills | Status: DC | PRN

## 2022-01-12 MED ORDER — PROMETHAZINE HCL 25 MG PO TABS: ORAL_TABLET | ORAL | 3 refills | Status: DC

## 2022-01-12 MED ORDER — METOPROLOL SUCCINATE ER 50 MG PO TB24: ORAL_TABLET | ORAL | 3 refills | Status: DC

## 2022-01-12 MED ORDER — HYDROCHLOROTHIAZIDE 12.5 MG PO CAPS: 12.5000 mg | ORAL_CAPSULE | Freq: Every day | ORAL | 3 refills | Status: DC

## 2022-01-12 MED ORDER — GABAPENTIN 300 MG PO CAPS: ORAL_CAPSULE | ORAL | 9 refills | Status: DC

## 2022-01-12 MED ORDER — LISINOPRIL 10 MG PO TABS: 10.0000 mg | ORAL_TABLET | Freq: Every day | ORAL | 1 refills | Status: DC

## 2022-01-12 NOTE — Assessment & Plan Note (Signed)
Promethazine renewed.

## 2022-01-12 NOTE — Progress Notes (Signed)
Deborah Zhang - 51 y.o. female MRN [id number]  Date of birth: Jan 09, 1971  Subjective No chief complaint on file.   HPI Deborah Zhang is a 51 y.o. here today for follow up.   BP remains well controlled with lisinopril, HCTZ and Toprol-XL.  She is tolerating medications at current strength, no side effects.  Denies chest pain, shortness of breathp, palpitations, headacher or vision changes.   Hydroxyzine has been helpful for GAD.  No side effects.    Neuropathy is managed with gabapentin.  Stable at this time.    ROS:  A comprehensive ROS was completed and negative except as noted per HPI  No Known Allergies  Past Medical History:  Diagnosis Date   Ankle pain 04/24/2011   Anxiety 08/04/2015   Benzodiazepine dependence ([place]) 12/02/2015   History of withdrawal    Chronic nausea 08/04/2015   Essential hypertension 12/17/2018   Generalized anxiety disorder 12/02/2015   Initiate exercise therapy while we taper down on benzodiazepines. Urgent referral to psychiatry.     History of colonic polyps 07/08/2016   Pathology benign 06/2016, f/u colonoscopy 5 years, normal random colon biopsies w/ no colitis   Muscle pain 12/02/2015   Consider changing dose of gabapentin and could increase amitriptyline, we'll discuss this further at next visit    Neuropathy 08/04/2015    Past Surgical History:  Procedure Laterality Date   ANKLE SURGERY     BREAST EXCISIONAL BIOPSY Left 10/2019   x2   BREAST LUMPECTOMY WITH RADIOACTIVE SEED LOCALIZATION Left 01/07/2020   Procedure: LEFT BREAST LUMPECTOMY X 2 WITH RADIOACTIVE SEED LOCALIZATION;  Surgeon: Coralie Keens, MD;  Location: [place];  Service: General;  Laterality: Left;   FOOT SURGERY     GALLBLADDER SURGERY     LEG SURGERY     TOTAL ABDOMINAL HYSTERECTOMY      Social History   Socioeconomic History   Marital status: Single    Spouse name: Sarahlynn Cisnero   Number of children: 2   Years of education: GED   Highest education  level: Not on file  Occupational History   Occupation: homemaker  Tobacco Use   Smoking status: Every Day    Packs/day: 1.00    Years: 20.00    Total pack years: 20.00    Types: Cigarettes   Smokeless tobacco: Never  Substance and Sexual Activity   Alcohol use: Yes    Alcohol/week: 1.0 standard drink of alcohol    Types: 1 Standard drinks or equivalent per week   Drug use: No   Sexual activity: Yes    Partners: Male  Other Topics Concern   Not on file  Social History Narrative   Not on file   Social Determinants of Health   Financial Resource Strain: Not on file  Food Insecurity: Not on file  Transportation Needs: Not on file  Physical Activity: Not on file  Stress: Not on file  Social Connections: Not on file    Family History  Problem Relation Age of Onset   Depression Father    Depression Mother    Hyperlipidemia Mother    Hypertension Mother     Health Maintenance  Topic Date Due   Zoster Vaccines- Shingrix (1 of 2) 04/14/2022 (Originally 01/02/1991)   Hepatitis C Screening  01/13/2023 (Originally 01/01/1990)   HIV Screening  01/13/2023 (Originally 01/02/1987)   INFLUENZA VACCINE  01/16/2022   MAMMOGRAM  05/16/2022   COLONOSCOPY (Pts 45-61yr Insurance coverage will need to be confirmed)  08/08/2026   TETANUS/TDAP  11/12/2028   HPV VACCINES  Aged Out   PAP SMEAR-Modifier  Discontinued   COVID-19 Vaccine  Discontinued     ----------------------------------------------------------------------------------------------------------------------------------------------------------------------------------------------------------------- Physical Exam BP 114/74 (BP Location: Left Arm, Patient Position: Sitting, Cuff Size: Normal)   Pulse 75   Ht '5\' 7"'$  (1.702 m)   Wt 165 lb (74.8 kg)   SpO2 96%   BMI 25.84 kg/m   Physical Exam Constitutional:      Appearance: Normal appearance.  Eyes:     General: No scleral icterus. Cardiovascular:     Rate and Rhythm:  Normal rate and regular rhythm.  Pulmonary:     Effort: Pulmonary effort is normal.     Breath sounds: Normal breath sounds.  Musculoskeletal:     Cervical back: Neck supple.  Neurological:     Mental Status: She is alert.  Psychiatric:        Mood and Affect: Mood normal.        Behavior: Behavior normal.     ------------------------------------------------------------------------------------------------------------------------------------------------------------------------------------------------------------------- Assessment and Plan  Essential hypertension BP remains well controlled.  Recommend continuation of current medications.  Smoking cessation advised.  Update labs today.   Neuropathy (HCC) Continue gabapentin up to 5x per day as needed.  Refills sent in.    Generalized anxiety disorder Continues on vistaril as needed for management of anxiety.  Encouraged exercise as well.   Chronic nausea Promethazine renewed.     Meds ordered this encounter  Medications   metoprolol succinate (TOPROL-XL) 50 MG 24 hr tablet    Sig: Take 1 TABLET BY MOUTH once daily with or immediately following a meal.    Dispense:  90 tablet    Refill:  3   hydrochlorothiazide (MICROZIDE) 12.5 MG capsule    Sig: Take 1 capsule (12.5 mg total) by mouth daily.    Dispense:  90 capsule    Refill:  3   hydrOXYzine (VISTARIL) 25 MG capsule    Sig: Take 1 capsule (25 mg total) by mouth every 8 (eight) hours as needed.    Dispense:  60 capsule    Refill:  6   gabapentin (NEURONTIN) 300 MG capsule    Sig: Take 1 capsule 5x per day as needed for neuropathy.    Dispense:  180 capsule    Refill:  9   lisinopril (ZESTRIL) 10 MG tablet    Sig: Take 1 tablet (10 mg total) by mouth daily.    Dispense:  90 tablet    Refill:  1   promethazine (PHENERGAN) 25 MG tablet    Sig: TAKE 1 TABLET BY MOUTH 2 TIMES DAILY AS NEEDED FOR NAUSEA.    Dispense:  60 tablet    Refill:  3    Return in about 6  months (around 07/15/2022) for HTN.    This visit occurred during the SARS-CoV-2 public health emergency.  Safety protocols were in place, including screening questions prior to the visit, additional usage of staff PPE, and extensive cleaning of exam room while observing appropriate contact time as indicated for disinfecting solutions.

## 2022-01-12 NOTE — Assessment & Plan Note (Signed)
BP remains well controlled.  Recommend continuation of current medications.  Smoking cessation advised.  Update labs today.

## 2022-01-12 NOTE — Assessment & Plan Note (Signed)
Continue gabapentin up to 5x per day as needed.  Refills sent in.

## 2022-01-12 NOTE — Assessment & Plan Note (Signed)
Continues on vistaril as needed for management of anxiety.  Encouraged exercise as well.

## 2022-01-13 LAB — CBC WITH DIFFERENTIAL/PLATELET
Absolute Monocytes: 1165 cells/uL — ABNORMAL HIGH (ref 200–950)
Basophils Absolute: 51 cells/uL (ref 0–200)
Basophils Relative: 0.4 %
Eosinophils Absolute: 154 cells/uL (ref 15–500)
Eosinophils Relative: 1.2 %
HCT: 42.5 % (ref 35.0–45.0)
Hemoglobin: 14.1 g/dL (ref 11.7–15.5)
Lymphs Abs: 1830 cells/uL (ref 850–3900)
MCH: 33.7 pg — ABNORMAL HIGH (ref 27.0–33.0)
MCHC: 33.2 g/dL (ref 32.0–36.0)
MCV: 101.7 fL — ABNORMAL HIGH (ref 80.0–100.0)
MPV: 9.4 fL (ref 7.5–12.5)
Monocytes Relative: 9.1 %
Neutro Abs: 9600 cells/uL — ABNORMAL HIGH (ref 1500–7800)
Neutrophils Relative %: 75 %
Platelets: 351 10*3/uL (ref 140–400)
RBC: 4.18 10*6/uL (ref 3.80–5.10)
RDW: 10.7 % — ABNORMAL LOW (ref 11.0–15.0)
Total Lymphocyte: 14.3 %
WBC: 12.8 10*3/uL — ABNORMAL HIGH (ref 3.8–10.8)

## 2022-01-13 LAB — COMPLETE METABOLIC PANEL WITH GFR
AG Ratio: 1.7 (calc) (ref 1.0–2.5)
ALT: 8 U/L (ref 6–29)
AST: 13 U/L (ref 10–35)
Albumin: 4.1 g/dL (ref 3.6–5.1)
Alkaline phosphatase (APISO): 66 U/L (ref 37–153)
BUN: 8 mg/dL (ref 7–25)
CO2: 27 mmol/L (ref 20–32)
Calcium: 9.5 mg/dL (ref 8.6–10.4)
Chloride: 105 mmol/L (ref 98–110)
Creat: 0.67 mg/dL (ref 0.50–1.03)
Globulin: 2.4 g/dL (calc) (ref 1.9–3.7)
Glucose, Bld: 95 mg/dL (ref 65–139)
Potassium: 4.5 mmol/L (ref 3.5–5.3)
Sodium: 138 mmol/L (ref 135–146)
Total Bilirubin: 0.5 mg/dL (ref 0.2–1.2)
Total Protein: 6.5 g/dL (ref 6.1–8.1)
eGFR: 106 mL/min/{1.73_m2} (ref >=60)

## 2022-01-13 LAB — LIPID PANEL W/REFLEX DIRECT LDL
Cholesterol: 227 mg/dL — ABNORMAL HIGH (ref <200)
HDL: 55 mg/dL (ref >=50)
LDL Cholesterol (Calc): 150 mg/dL (calc) — ABNORMAL HIGH
Non-HDL Cholesterol (Calc): 172 mg/dL (calc) — ABNORMAL HIGH (ref <130)
Total CHOL/HDL Ratio: 4.1 (calc) (ref <5.0)
Triglycerides: 103 mg/dL (ref <150)

## 2022-01-13 LAB — TSH: TSH: 2.45 mIU/L

## 2022-01-17 ENCOUNTER — Other Ambulatory Visit: Payer: Self-pay | Admitting: Family Medicine

## 2022-01-17 DIAGNOSIS — D72829 Elevated white blood cell count, unspecified: Secondary | ICD-10-CM

## 2022-05-31 ENCOUNTER — Other Ambulatory Visit: Payer: Self-pay | Admitting: Family Medicine

## 2022-05-31 ENCOUNTER — Telehealth: Payer: Self-pay | Admitting: Family Medicine

## 2022-05-31 DIAGNOSIS — R110 Nausea: Secondary | ICD-10-CM

## 2022-05-31 MED ORDER — PROMETHAZINE HCL 25 MG PO TABS: ORAL_TABLET | ORAL | 0 refills | Status: DC

## 2022-05-31 NOTE — Telephone Encounter (Signed)
Patient called requested refills on promethazine (PHENERGAN) 25 MG tablet  she has an appt scheduled for 07-17-21.

## 2022-07-17 ENCOUNTER — Encounter: Payer: Self-pay | Admitting: Family Medicine

## 2022-07-17 ENCOUNTER — Ambulatory Visit (INDEPENDENT_AMBULATORY_CARE_PROVIDER_SITE_OTHER): Payer: BC Managed Care – PPO

## 2022-07-17 ENCOUNTER — Ambulatory Visit: Payer: BC Managed Care – PPO | Admitting: Family Medicine

## 2022-07-17 VITALS — BP 135/84 | HR 73 | Ht 67.0 in | Wt 168.0 lb

## 2022-07-17 DIAGNOSIS — R059 Cough, unspecified: Secondary | ICD-10-CM | POA: Diagnosis not present

## 2022-07-17 DIAGNOSIS — Z23 Encounter for immunization: Secondary | ICD-10-CM | POA: Diagnosis not present

## 2022-07-17 DIAGNOSIS — R0789 Other chest pain: Secondary | ICD-10-CM | POA: Insufficient documentation

## 2022-07-17 DIAGNOSIS — R051 Acute cough: Secondary | ICD-10-CM | POA: Diagnosis not present

## 2022-07-17 DIAGNOSIS — I10 Essential (primary) hypertension: Secondary | ICD-10-CM

## 2022-07-17 DIAGNOSIS — D2330 Other benign neoplasm of skin of unspecified part of face: Secondary | ICD-10-CM

## 2022-07-17 DIAGNOSIS — R0781 Pleurodynia: Secondary | ICD-10-CM | POA: Diagnosis not present

## 2022-07-17 DIAGNOSIS — F17210 Nicotine dependence, cigarettes, uncomplicated: Secondary | ICD-10-CM | POA: Diagnosis not present

## 2022-07-17 DIAGNOSIS — Z1231 Encounter for screening mammogram for malignant neoplasm of breast: Secondary | ICD-10-CM

## 2022-07-17 DIAGNOSIS — F411 Generalized anxiety disorder: Secondary | ICD-10-CM

## 2022-07-17 NOTE — Assessment & Plan Note (Signed)
Continue vistaril prn at current strength.

## 2022-07-17 NOTE — Assessment & Plan Note (Signed)
BP remains pretty well controlled.  Recommend continuation of current medications for management of HTN.

## 2022-07-17 NOTE — Addendum Note (Signed)
Addended by: Perlie Mayo on: 07/17/2022 01:10 PM   Modules accepted: Orders

## 2022-07-17 NOTE — Assessment & Plan Note (Signed)
Counseled on smoking cessation.  Not read to quit at this time.  Discussed LDCT for lung cancer screening.  She would like to have this done.  Orders entered.

## 2022-07-17 NOTE — Assessment & Plan Note (Signed)
Cough improving.  Still having quite a bit of rib pain.  CXR with rib detail ordered.

## 2022-07-17 NOTE — Progress Notes (Signed)
Deborah Zhang - 52 y.o. female MRN [id number]  Date of birth: 1970-08-17  Subjective Chief Complaint  Patient presents with   Hypertension    HPI Deborah Zhang is a 52 y.o. female here today for follow up visit.   She continues on metoprolol, lisinopril  and HCTZ for management of HTN.  BP has been fairly well controlled with this.  She is tolerating medications well.  Denies side effects.  Has not had chest pain, shortness of breath, palpitations, headache or vision changes.   Has rib pain for a few weeks.  She had a cough for about 8 weeks.  Cough is improving.  Rib pain has improved slightly.  Denies any popping sensation during coughing.  No shortness of breath.  She is a daily smoker.   She has a nodule on the face.  Area is non-painful.  It has changed in size over the past year.  No drainage from area.   ROS:  A comprehensive ROS was completed and negative except as noted per HPI  No Known Allergies  Past Medical History:  Diagnosis Date   Ankle pain 04/24/2011   Anxiety 08/04/2015   Benzodiazepine dependence ([place]) 12/02/2015   History of withdrawal    Chronic nausea 08/04/2015   Essential hypertension 12/17/2018   Generalized anxiety disorder 12/02/2015   Initiate exercise therapy while we taper down on benzodiazepines. Urgent referral to psychiatry.     History of colonic polyps 07/08/2016   Pathology benign 06/2016, f/u colonoscopy 5 years, normal random colon biopsies w/ no colitis   Muscle pain 12/02/2015   Consider changing dose of gabapentin and could increase amitriptyline, we'll discuss this further at next visit    Neuropathy 08/04/2015    Past Surgical History:  Procedure Laterality Date   ANKLE SURGERY     BREAST EXCISIONAL BIOPSY Left 10/2019   x2   BREAST LUMPECTOMY WITH RADIOACTIVE SEED LOCALIZATION Left 01/07/2020   Procedure: LEFT BREAST LUMPECTOMY X 2 WITH RADIOACTIVE SEED LOCALIZATION;  Surgeon: Coralie Keens, MD;  Location: [facility];  Service: General;  Laterality: Left;   FOOT SURGERY     GALLBLADDER SURGERY     LEG SURGERY     TOTAL ABDOMINAL HYSTERECTOMY      Social History   Socioeconomic History   Marital status: Single    Spouse name: Jazzlin Clements   Number of children: 2   Years of education: GED   Highest education level: Not on file  Occupational History   Occupation: homemaker  Tobacco Use   Smoking status: Every Day    Packs/day: 1.00    Years: 20.00    Total pack years: 20.00    Types: Cigarettes   Smokeless tobacco: Never  Substance and Sexual Activity   Alcohol use: Yes    Alcohol/week: 1.0 standard drink of alcohol    Types: 1 Standard drinks or equivalent per week   Drug use: No   Sexual activity: Yes    Partners: Male  Other Topics Concern   Not on file  Social History Narrative   Not on file   Social Determinants of Health   Financial Resource Strain: Not on file  Food Insecurity: Not on file  Transportation Needs: Not on file  Physical Activity: Not on file  Stress: Not on file  Social Connections: Not on file    Family History  Problem Relation Age of Onset   Depression Father    Depression Mother  Hyperlipidemia Mother    Hypertension Mother     Health Maintenance  Topic Date Due   Lung Cancer Screening  Never done   MAMMOGRAM  05/16/2022   Hepatitis C Screening  01/13/2023 (Originally 01/01/1990)   HIV Screening  01/13/2023 (Originally 01/02/1987)   Zoster Vaccines- Shingrix (2 of 2) 09/11/2022   COLONOSCOPY (Pts 45-19yr Insurance coverage will need to be confirmed)  08/08/2026   DTaP/Tdap/Td (2 - Td or Tdap) 11/12/2028   INFLUENZA VACCINE  Completed   HPV VACCINES  Aged Out   PAP SMEAR-Modifier  Discontinued   COVID-19 Vaccine  Discontinued      ----------------------------------------------------------------------------------------------------------------------------------------------------------------------------------------------------------------- Physical Exam BP 135/84 (BP Location: Left Arm, Patient Position: Sitting, Cuff Size: Large)   Pulse 73   Ht '5\' 7"'$  (1.702 m)   Wt 168 lb (76.2 kg)   SpO2 100%   BMI 26.31 kg/m   Physical Exam Constitutional:      Appearance: Normal appearance.  Eyes:     General: No scleral icterus. Cardiovascular:     Rate and Rhythm: Normal rate and regular rhythm.  Pulmonary:     Effort: Pulmonary effort is normal.     Breath sounds: Normal breath sounds.  Musculoskeletal:     Cervical back: Neck supple.  Skin:    Comments: Somewhat ruddy appearance with multiple telangiectasia of the face.  There is nodular, cyst like area just above the left side of the upper lip.  No pain with palpation.    Neurological:     Mental Status: She is alert.     ------------------------------------------------------------------------------------------------------------------------------------------------------------------------------------------------------------------- Assessment and Plan  Essential hypertension BP remains pretty well controlled.  Recommend continuation of current medications for management of HTN.    Generalized anxiety disorder Continue vistaril prn at current strength.    Rib pain Cough improving.  Still having quite a bit of rib pain.  CXR with rib detail ordered.   Smoking greater than 20 pack years Counseled on smoking cessation.  Not read to quit at this time.  Discussed LDCT for lung cancer screening.  She would like to have this done.  Orders entered.    No orders of the defined types were placed in this encounter.   Return in about 6 months (around 01/15/2023) for HTN/GAD.    This visit occurred during the SARS-CoV-2 public health emergency.  Safety  protocols were in place, including screening questions prior to the visit, additional usage of staff PPE, and extensive cleaning of exam room while observing appropriate contact time as indicated for disinfecting solutions.

## 2022-07-24 ENCOUNTER — Ambulatory Visit (INDEPENDENT_AMBULATORY_CARE_PROVIDER_SITE_OTHER): Payer: BC Managed Care – PPO

## 2022-07-24 DIAGNOSIS — F17210 Nicotine dependence, cigarettes, uncomplicated: Secondary | ICD-10-CM | POA: Diagnosis not present

## 2022-07-30 ENCOUNTER — Other Ambulatory Visit: Payer: Self-pay | Admitting: Family Medicine

## 2022-07-30 DIAGNOSIS — N6320 Unspecified lump in the left breast, unspecified quadrant: Secondary | ICD-10-CM

## 2022-07-30 DIAGNOSIS — R110 Nausea: Secondary | ICD-10-CM

## 2022-08-06 ENCOUNTER — Other Ambulatory Visit: Payer: Self-pay | Admitting: Family Medicine

## 2022-08-06 DIAGNOSIS — N63 Unspecified lump in unspecified breast: Secondary | ICD-10-CM

## 2022-08-08 DIAGNOSIS — L720 Epidermal cyst: Secondary | ICD-10-CM | POA: Diagnosis not present

## 2022-08-15 DIAGNOSIS — L720 Epidermal cyst: Secondary | ICD-10-CM | POA: Diagnosis not present

## 2022-08-28 ENCOUNTER — Other Ambulatory Visit: Payer: Self-pay | Admitting: Family Medicine

## 2022-08-28 DIAGNOSIS — R110 Nausea: Secondary | ICD-10-CM

## 2022-09-11 ENCOUNTER — Other Ambulatory Visit: Payer: Self-pay | Admitting: Family Medicine

## 2022-09-11 DIAGNOSIS — F419 Anxiety disorder, unspecified: Secondary | ICD-10-CM

## 2022-09-11 DIAGNOSIS — I10 Essential (primary) hypertension: Secondary | ICD-10-CM

## 2022-09-26 ENCOUNTER — Other Ambulatory Visit: Payer: Self-pay | Admitting: Family Medicine

## 2022-09-26 DIAGNOSIS — R110 Nausea: Secondary | ICD-10-CM

## 2022-09-28 ENCOUNTER — Other Ambulatory Visit: Payer: Self-pay | Admitting: Family Medicine

## 2022-09-28 DIAGNOSIS — R110 Nausea: Secondary | ICD-10-CM

## 2022-10-01 ENCOUNTER — Other Ambulatory Visit: Payer: Self-pay | Admitting: Family Medicine

## 2022-10-01 DIAGNOSIS — F419 Anxiety disorder, unspecified: Secondary | ICD-10-CM

## 2022-10-11 ENCOUNTER — Ambulatory Visit: Payer: BC Managed Care – PPO | Admitting: Family Medicine

## 2022-10-11 ENCOUNTER — Encounter: Payer: Self-pay | Admitting: Family Medicine

## 2022-10-11 VITALS — BP 117/74 | HR 75 | Ht 67.0 in | Wt 167.0 lb

## 2022-10-11 DIAGNOSIS — R739 Hyperglycemia, unspecified: Secondary | ICD-10-CM

## 2022-10-11 DIAGNOSIS — I10 Essential (primary) hypertension: Secondary | ICD-10-CM

## 2022-10-11 DIAGNOSIS — H6501 Acute serous otitis media, right ear: Secondary | ICD-10-CM

## 2022-10-11 DIAGNOSIS — R7309 Other abnormal glucose: Secondary | ICD-10-CM | POA: Diagnosis not present

## 2022-10-11 DIAGNOSIS — H6590 Unspecified nonsuppurative otitis media, unspecified ear: Secondary | ICD-10-CM | POA: Insufficient documentation

## 2022-10-11 LAB — POCT GLYCOSYLATED HEMOGLOBIN (HGB A1C): Hemoglobin A1C: 4.9 % (ref 4.0–5.6)

## 2022-10-11 NOTE — Assessment & Plan Note (Signed)
Cerumen impaction cleared with some improvement of her symptoms.  She does have a serous effusion on the right middle ear.  Recommend adding Flonase daily.

## 2022-10-11 NOTE — Progress Notes (Addendum)
Deborah Zhang - 52 y.o. female MRN [id number]  Date of birth: 25-Apr-1971  Subjective Chief Complaint  Patient presents with   Ear Fullness    HPI Deborah Zhang is a 52 year old female here today with complaint of ear fullness.  This started after taking a bubble bath last week.  Feels like she has water in her dizziness.  She denies dizziness significant hearing changes, fever or drainage from the ear.  She has had some issues with allergies in the past.  Also had issues with excess wax buildup previously.    She also needs to have her A1c checked.  Her dentist is requesting this.  She has no history of diabetes.  She has had some mildly elevated random glucose levels.  ROS:  A comprehensive ROS was completed and negative except as noted per HPI  No Known Allergies  Past Medical History:  Diagnosis Date   Ankle pain 04/24/2011   Anxiety 08/04/2015   Benzodiazepine dependence ([facility]) 12/02/2015   History of withdrawal    Chronic nausea 08/04/2015   Essential hypertension 12/17/2018   Generalized anxiety disorder 12/02/2015   Initiate exercise therapy while we taper down on benzodiazepines. Urgent referral to psychiatry.     History of colonic polyps 07/08/2016   Pathology benign 06/2016, f/u colonoscopy 5 years, normal random colon biopsies w/ no colitis   Muscle pain 12/02/2015   Consider changing dose of gabapentin and could increase amitriptyline, we'll discuss this further at next visit    Neuropathy 08/04/2015    Past Surgical History:  Procedure Laterality Date   ANKLE SURGERY     BREAST EXCISIONAL BIOPSY Left 10/2019   x2   BREAST LUMPECTOMY WITH RADIOACTIVE SEED LOCALIZATION Left 01/07/2020   Procedure: LEFT BREAST LUMPECTOMY X 2 WITH RADIOACTIVE SEED LOCALIZATION;  Surgeon: Abigail Miyamoto, MD;  Location: [facility];  Service: General;  Laterality: Left;   FOOT SURGERY     GALLBLADDER SURGERY     LEG SURGERY     TOTAL ABDOMINAL HYSTERECTOMY      Social History    Socioeconomic History   Marital status: Single    Spouse name: Gladyes Kudo   Number of children: 2   Years of education: GED   Highest education level: Not on file  Occupational History   Occupation: homemaker  Tobacco Use   Smoking status: Every Day    Packs/day: 1.00    Years: 20.00    Additional pack years: 0.00    Total pack years: 20.00    Types: Cigarettes   Smokeless tobacco: Never  Substance and Sexual Activity   Alcohol use: Yes    Alcohol/week: 1.0 standard drink of alcohol    Types: 1 Standard drinks or equivalent per week   Drug use: No   Sexual activity: Yes    Partners: Male  Other Topics Concern   Not on file  Social History Narrative   Not on file   Social Determinants of Health   Financial Resource Strain: Not on file  Food Insecurity: Not on file  Transportation Needs: Not on file  Physical Activity: Not on file  Stress: Not on file  Social Connections: Not on file    Family History  Problem Relation Age of Onset   Depression Father    Depression Mother    Hyperlipidemia Mother    Hypertension Mother     Health Maintenance  Topic Date Due   MAMMOGRAM  05/16/2022   Hepatitis C Screening  01/13/2023 (Originally  01/01/1990)   HIV Screening  01/13/2023 (Originally 01/02/1987)   Zoster Vaccines- Shingrix (2 of 2) 04/12/2023 (Originally 09/11/2022)   INFLUENZA VACCINE  01/17/2023   Lung Cancer Screening  07/25/2023   COLONOSCOPY (Pts 45-65yrs Insurance coverage will need to be confirmed)  08/08/2026   DTaP/Tdap/Td (2 - Td or Tdap) 11/12/2028   HPV VACCINES  Aged Out   PAP SMEAR-Modifier  Discontinued   COVID-19 Vaccine  Discontinued     ----------------------------------------------------------------------------------------------------------------------------------------------------------------------------------------------------------------- Physical Exam BP 117/74 (BP Location: Right Arm, Patient Position: Sitting, Cuff Size: Normal)    Pulse 75   Ht 5\' 7"  (1.702 m)   Wt 167 lb (75.8 kg)   SpO2 99%   BMI 26.16 kg/m   Physical Exam Constitutional:      Appearance: Normal appearance.  HENT:     Head: Normocephalic and atraumatic.     Left Ear: Tympanic membrane normal.     Ears:     Comments: Cerumen impaction bilaterally cleared with lavage.  Canal normal after lavage.  She does have serous effusion in the right ear. Eyes:     General: No scleral icterus. Cardiovascular:     Rate and Rhythm: Normal rate and regular rhythm.  Pulmonary:     Effort: Pulmonary effort is normal.     Breath sounds: Normal breath sounds.  Musculoskeletal:     Cervical back: Neck supple.  Neurological:     Mental Status: She is alert.  Psychiatric:        Mood and Affect: Mood normal.        Behavior: Behavior normal.     ------------------------------------------------------------------------------------------------------------------------------------------------------------------------------------------------------------------- Assessment and Plan  Essential hypertension BP is well controlled.  I recommend continuation of current medications for management of HTN.   Elevated random blood glucose level Normal A1c.  Form completed for her dentist.  Serous otitis media Cerumen impaction cleared with some improvement of her symptoms.  She does have a serous effusion on the right middle ear.  Recommend adding Flonase daily.  She is using Zyrtec or not will forward to   No orders of the defined types were placed in this encounter.   No follow-ups on file.    This visit occurred during the SARS-CoV-2 public health emergency.  Safety protocols were in place, including screening questions prior to the visit, additional usage of staff PPE, and extensive cleaning of exam room while observing appropriate contact time as indicated for disinfecting solutions.

## 2022-10-11 NOTE — Assessment & Plan Note (Addendum)
BP is well controlled.  I recommend continuation of current medications for management of HTN.

## 2022-10-11 NOTE — Assessment & Plan Note (Signed)
Normal A1c.  Form completed for her dentist.

## 2022-10-17 ENCOUNTER — Other Ambulatory Visit: Payer: Self-pay | Admitting: Family Medicine

## 2022-10-17 DIAGNOSIS — F419 Anxiety disorder, unspecified: Secondary | ICD-10-CM

## 2022-10-17 DIAGNOSIS — I10 Essential (primary) hypertension: Secondary | ICD-10-CM

## 2022-10-25 ENCOUNTER — Other Ambulatory Visit: Payer: Self-pay | Admitting: Family Medicine

## 2022-10-25 DIAGNOSIS — R110 Nausea: Secondary | ICD-10-CM

## 2022-10-26 DIAGNOSIS — J40 Bronchitis, not specified as acute or chronic: Secondary | ICD-10-CM | POA: Diagnosis not present

## 2022-10-26 DIAGNOSIS — I10 Essential (primary) hypertension: Secondary | ICD-10-CM | POA: Diagnosis not present

## 2022-10-26 DIAGNOSIS — R0602 Shortness of breath: Secondary | ICD-10-CM | POA: Diagnosis not present

## 2022-10-26 DIAGNOSIS — Z720 Tobacco use: Secondary | ICD-10-CM | POA: Diagnosis not present

## 2022-10-26 DIAGNOSIS — R091 Pleurisy: Secondary | ICD-10-CM | POA: Diagnosis not present

## 2022-10-26 DIAGNOSIS — M79602 Pain in left arm: Secondary | ICD-10-CM | POA: Diagnosis not present

## 2022-10-26 DIAGNOSIS — R079 Chest pain, unspecified: Secondary | ICD-10-CM | POA: Diagnosis not present

## 2022-10-26 DIAGNOSIS — G629 Polyneuropathy, unspecified: Secondary | ICD-10-CM | POA: Diagnosis not present

## 2022-10-26 DIAGNOSIS — R0789 Other chest pain: Secondary | ICD-10-CM | POA: Diagnosis not present

## 2022-11-14 ENCOUNTER — Other Ambulatory Visit: Payer: Self-pay | Admitting: Family Medicine

## 2022-11-14 DIAGNOSIS — F419 Anxiety disorder, unspecified: Secondary | ICD-10-CM

## 2022-11-21 ENCOUNTER — Other Ambulatory Visit: Payer: Self-pay | Admitting: Family Medicine

## 2022-11-21 DIAGNOSIS — I10 Essential (primary) hypertension: Secondary | ICD-10-CM

## 2022-11-22 ENCOUNTER — Other Ambulatory Visit: Payer: Self-pay | Admitting: Family Medicine

## 2022-11-22 DIAGNOSIS — R110 Nausea: Secondary | ICD-10-CM

## 2022-11-23 ENCOUNTER — Other Ambulatory Visit: Payer: Self-pay | Admitting: Family Medicine

## 2022-11-23 DIAGNOSIS — R110 Nausea: Secondary | ICD-10-CM

## 2022-12-08 ENCOUNTER — Other Ambulatory Visit: Payer: Self-pay | Admitting: Family Medicine

## 2022-12-08 DIAGNOSIS — I10 Essential (primary) hypertension: Secondary | ICD-10-CM

## 2022-12-10 ENCOUNTER — Other Ambulatory Visit: Payer: Self-pay | Admitting: Family Medicine

## 2022-12-10 DIAGNOSIS — I10 Essential (primary) hypertension: Secondary | ICD-10-CM

## 2022-12-17 ENCOUNTER — Other Ambulatory Visit: Payer: Self-pay | Admitting: Family Medicine

## 2022-12-25 ENCOUNTER — Other Ambulatory Visit: Payer: Self-pay | Admitting: Family Medicine

## 2022-12-25 DIAGNOSIS — R110 Nausea: Secondary | ICD-10-CM

## 2022-12-26 ENCOUNTER — Other Ambulatory Visit: Payer: Self-pay | Admitting: Family Medicine

## 2022-12-26 DIAGNOSIS — F419 Anxiety disorder, unspecified: Secondary | ICD-10-CM

## 2022-12-28 ENCOUNTER — Other Ambulatory Visit: Payer: Self-pay | Admitting: Family Medicine

## 2022-12-28 DIAGNOSIS — R110 Nausea: Secondary | ICD-10-CM

## 2023-01-14 ENCOUNTER — Other Ambulatory Visit: Payer: Self-pay | Admitting: Family Medicine

## 2023-01-15 ENCOUNTER — Encounter: Payer: Self-pay | Admitting: Family Medicine

## 2023-01-15 ENCOUNTER — Ambulatory Visit: Payer: BC Managed Care – PPO | Admitting: Family Medicine

## 2023-01-15 VITALS — BP 127/85 | HR 107 | Ht 67.0 in | Wt 166.0 lb

## 2023-01-15 DIAGNOSIS — R110 Nausea: Secondary | ICD-10-CM

## 2023-01-15 DIAGNOSIS — F17210 Nicotine dependence, cigarettes, uncomplicated: Secondary | ICD-10-CM

## 2023-01-15 DIAGNOSIS — I10 Essential (primary) hypertension: Secondary | ICD-10-CM | POA: Diagnosis not present

## 2023-01-15 DIAGNOSIS — Z1231 Encounter for screening mammogram for malignant neoplasm of breast: Secondary | ICD-10-CM | POA: Diagnosis not present

## 2023-01-15 DIAGNOSIS — R7309 Other abnormal glucose: Secondary | ICD-10-CM | POA: Diagnosis not present

## 2023-01-15 DIAGNOSIS — Z13220 Encounter for screening for lipoid disorders: Secondary | ICD-10-CM | POA: Diagnosis not present

## 2023-01-15 DIAGNOSIS — F419 Anxiety disorder, unspecified: Secondary | ICD-10-CM

## 2023-01-15 DIAGNOSIS — F411 Generalized anxiety disorder: Secondary | ICD-10-CM

## 2023-01-15 DIAGNOSIS — G629 Polyneuropathy, unspecified: Secondary | ICD-10-CM

## 2023-01-15 MED ORDER — HYDROXYZINE PAMOATE 25 MG PO CAPS
25.0000 mg | ORAL_CAPSULE | Freq: Three times a day (TID) | ORAL | 3 refills | Status: DC | PRN
Start: 2023-01-15 — End: 2023-04-16

## 2023-01-15 MED ORDER — LISINOPRIL 10 MG PO TABS
10.0000 mg | ORAL_TABLET | Freq: Every day | ORAL | 3 refills | Status: DC
Start: 2023-01-15 — End: 2024-03-12

## 2023-01-15 MED ORDER — METOPROLOL SUCCINATE ER 50 MG PO TB24: ORAL_TABLET | ORAL | 3 refills | Status: DC

## 2023-01-15 MED ORDER — GABAPENTIN 300 MG PO CAPS: ORAL_CAPSULE | ORAL | 3 refills | Status: DC

## 2023-01-15 MED ORDER — PROMETHAZINE HCL 25 MG PO TABS
ORAL_TABLET | ORAL | 3 refills | Status: DC
Start: 2023-01-15 — End: 2023-05-23

## 2023-01-15 MED ORDER — HYDROCHLOROTHIAZIDE 12.5 MG PO CAPS
12.5000 mg | ORAL_CAPSULE | Freq: Every day | ORAL | 3 refills | Status: DC
Start: 2023-01-15 — End: 2024-01-15

## 2023-01-15 NOTE — Assessment & Plan Note (Signed)
Blood pressure mains well-controlled.  She will continue current medications for management of hypertension.

## 2023-01-15 NOTE — Assessment & Plan Note (Signed)
Gabapentin is working well for her.  She will continue this at current strength.

## 2023-01-15 NOTE — Assessment & Plan Note (Signed)
Previously on benzodiazepines.  Has done well on Vistaril at current strength.

## 2023-01-15 NOTE — Progress Notes (Signed)
Deborah Zhang - 52 y.o. female MRN [id number]  Date of birth: Feb 14, 1971  Subjective Chief Complaint  Patient presents with   Hypertension   Anxiety    HPI Deborah Zhang is a 52 y.o. female here today for follow up.    She reports that she is doing pretty well.   Continues on lisinopril, hydrochlorothiazide and metoprolol for management of HTN.  Her BP today is well controlled.  She is tolerating this well at current strength.  She denies symptoms related to HTN including chest pain, shortness of breath, palpitations, headache or vision changes. She does continue to smoke daily.   Remains on hydroxyzine as needed for anxiety.  This continues to work pretty well for her.  She has not experienced any side effects.  Uses promethazine as needed for nausea.  This is chronic.  She has had previous GI evaluation.    ROS:  A comprehensive ROS was completed and negative except as noted per HPI  No Known Allergies  Past Medical History:  Diagnosis Date   Ankle pain 04/24/2011   Anxiety 08/04/2015   Benzodiazepine dependence (HCC) 12/02/2015   History of withdrawal    Chronic nausea 08/04/2015   Essential hypertension 12/17/2018   Generalized anxiety disorder 12/02/2015   Initiate exercise therapy while we taper down on benzodiazepines. Urgent referral to psychiatry.     History of colonic polyps 07/08/2016   Pathology benign 06/2016, f/u colonoscopy 5 years, normal random colon biopsies w/ no colitis   Muscle pain 12/02/2015   Consider changing dose of gabapentin and could increase amitriptyline, we'll discuss this further at next visit    Neuropathy 08/04/2015    Past Surgical History:  Procedure Laterality Date   ANKLE SURGERY     BREAST EXCISIONAL BIOPSY Left 10/2019   x2   BREAST LUMPECTOMY WITH RADIOACTIVE SEED LOCALIZATION Left 01/07/2020   Procedure: LEFT BREAST LUMPECTOMY X 2 WITH RADIOACTIVE SEED LOCALIZATION;  Surgeon: Abigail Miyamoto, MD;  Location: [facility];  Service: General;  Laterality: Left;   FOOT SURGERY     GALLBLADDER SURGERY     LEG SURGERY     TOTAL ABDOMINAL HYSTERECTOMY      Social History   Socioeconomic History   Marital status: Single    Spouse name: Shamonique Rogalla   Number of children: 2   Years of education: GED   Highest education level: Not on file  Occupational History   Occupation: homemaker  Tobacco Use   Smoking status: Every Day    Current packs/day: 1.00    Average packs/day: 1 pack/day for 20.0 years (20.0 ttl pk-yrs)    Types: Cigarettes   Smokeless tobacco: Never  Substance and Sexual Activity   Alcohol use: Yes    Alcohol/week: 1.0 standard drink of alcohol    Types: 1 Standard drinks or equivalent per week   Drug use: No   Sexual activity: Yes    Partners: Male  Other Topics Concern   Not on file  Social History Narrative   Not on file   Social Determinants of Health   Financial Resource Strain: Not on file  Food Insecurity: Not on file  Transportation Needs: Not on file  Physical Activity: Not on file  Stress: Not on file  Social Connections: Not on file    Family History  Problem Relation Age of Onset   Depression Father    Depression Mother    Hyperlipidemia Mother    Hypertension Mother  Health Maintenance  Topic Date Due   MAMMOGRAM  05/16/2022   Zoster Vaccines- Shingrix (2 of 2) 04/12/2023 (Originally 09/11/2022)   Hepatitis C Screening  01/15/2024 (Originally 01/01/1990)   HIV Screening  01/15/2024 (Originally 01/02/1987)   INFLUENZA VACCINE  01/17/2023   Lung Cancer Screening  07/25/2023   Colonoscopy  08/08/2026   DTaP/Tdap/Td (2 - Td or Tdap) 11/12/2028   HPV VACCINES  Aged Out   PAP SMEAR-Modifier  Discontinued   COVID-19 Vaccine  Discontinued      ----------------------------------------------------------------------------------------------------------------------------------------------------------------------------------------------------------------- Physical Exam BP 127/85 (BP Location: Left Arm, Patient Position: Sitting, Cuff Size: Normal)   Pulse (!) 107   Ht 5\' 7"  (1.702 m)   Wt 166 lb (75.3 kg)   SpO2 99%   BMI 26.00 kg/m   Physical Exam Constitutional:      Appearance: Normal appearance.  HENT:     Head: Normocephalic and atraumatic.  Eyes:     General: No scleral icterus. Cardiovascular:     Rate and Rhythm: Normal rate and regular rhythm.  Pulmonary:     Effort: Pulmonary effort is normal.     Breath sounds: Normal breath sounds.  Neurological:     General: No focal deficit present.     Mental Status: She is alert.  Psychiatric:        Mood and Affect: Mood normal.        Behavior: Behavior normal.     ------------------------------------------------------------------------------------------------------------------------------------------------------------------------------------------------------------------- Assessment and Plan  Essential hypertension Blood pressure mains well-controlled.  She will continue current medications for management of hypertension.  Neuropathy (HCC) Gabapentin is working well for her.  She will continue this at current strength.  Generalized anxiety disorder Previously on benzodiazepines.  Has done well on Vistaril at current strength.  Smoking greater than 20 pack years Continue to counsel smoking cessation.   Meds ordered this encounter  Medications   gabapentin (NEURONTIN) 300 MG capsule    Sig: Take 1 capsule BY MOUTH 5 TIMES per day as needed for neuropathy.    Dispense:  180 capsule    Refill:  3   hydrochlorothiazide (MICROZIDE) 12.5 MG capsule    Sig: Take 1 capsule (12.5 mg total) by mouth daily.    Dispense:  90 capsule    Refill:  3    hydrOXYzine (VISTARIL) 25 MG capsule    Sig: Take 1 capsule (25 mg total) by mouth every 8 (eight) hours as needed.    Dispense:  60 capsule    Refill:  3   lisinopril (ZESTRIL) 10 MG tablet    Sig: Take 1 tablet (10 mg total) by mouth daily.    Dispense:  90 tablet    Refill:  3   metoprolol succinate (TOPROL-XL) 50 MG 24 hr tablet    Sig: Take 1 TABLET BY MOUTH once daily with or immediately following a meal.    Dispense:  90 tablet    Refill:  3   promethazine (PHENERGAN) 25 MG tablet    Sig: TAKE 1 TABLET BY MOUTH 2 TIMES DAILY AS NEEDED FOR NAUSEA.    Dispense:  60 tablet    Refill:  3    Return in about 6 months (around 07/18/2023) for HTN/GAD.    This visit occurred during the SARS-CoV-2 public health emergency.  Safety protocols were in place, including screening questions prior to the visit, additional usage of staff PPE, and extensive cleaning of exam room while observing appropriate contact time as indicated for disinfecting solutions.

## 2023-01-15 NOTE — Assessment & Plan Note (Signed)
Continue to counsel smoking cessation.

## 2023-01-30 ENCOUNTER — Telehealth: Payer: Self-pay

## 2023-01-30 ENCOUNTER — Other Ambulatory Visit: Payer: Self-pay | Admitting: Family Medicine

## 2023-01-30 MED ORDER — ATORVASTATIN CALCIUM 20 MG PO TABS: 20.0000 mg | ORAL_TABLET | Freq: Every day | ORAL | 2 refills | Status: DC

## 2023-01-30 NOTE — Telephone Encounter (Signed)
Yes, that is ok  CM

## 2023-01-30 NOTE — Telephone Encounter (Signed)
Patient informed. - is the patient o.k. to wait for second shingles vaccine until September - her last was given in January 2024.?

## 2023-01-30 NOTE — Telephone Encounter (Signed)
At the end of July had spoken with patient regarding lab results.  She states the statin was never sent to the pharmacy that she is aware of and she was questioning what Dr. Ashley Royalty thoughts was the cause for her elevated WBC counts.  Also is it o.k. to schedule  patient for nurse visit for second shingles vaccine? Last given 07/17/2022.

## 2023-01-31 NOTE — Telephone Encounter (Signed)
Attempted call to patient.  Voice mail box not yet set up - could not leave a voice mail message.

## 2023-02-01 ENCOUNTER — Ambulatory Visit: Payer: BC Managed Care – PPO

## 2023-02-01 NOTE — Telephone Encounter (Signed)
Attempted call to patient. Voice mail box not yet set up . Could not leave a voice mail message.

## 2023-02-01 NOTE — Telephone Encounter (Signed)
Patient informed and schld for 02/20/23 =kph

## 2023-02-20 ENCOUNTER — Ambulatory Visit: Payer: BC Managed Care – PPO

## 2023-04-15 ENCOUNTER — Other Ambulatory Visit: Payer: Self-pay | Admitting: Family Medicine

## 2023-04-15 DIAGNOSIS — F419 Anxiety disorder, unspecified: Secondary | ICD-10-CM

## 2023-05-14 ENCOUNTER — Other Ambulatory Visit: Payer: Self-pay | Admitting: Family Medicine

## 2023-05-23 ENCOUNTER — Other Ambulatory Visit: Payer: Self-pay | Admitting: Family Medicine

## 2023-05-23 DIAGNOSIS — R110 Nausea: Secondary | ICD-10-CM

## 2023-07-12 ENCOUNTER — Other Ambulatory Visit: Payer: Self-pay | Admitting: Family Medicine

## 2023-07-18 ENCOUNTER — Ambulatory Visit: Payer: BC Managed Care – PPO | Admitting: Family Medicine

## 2023-07-18 ENCOUNTER — Encounter: Payer: Self-pay | Admitting: Family Medicine

## 2023-07-18 VITALS — BP 119/77 | HR 70 | Ht 67.0 in | Wt 168.0 lb

## 2023-07-18 DIAGNOSIS — F419 Anxiety disorder, unspecified: Secondary | ICD-10-CM | POA: Diagnosis not present

## 2023-07-18 DIAGNOSIS — Z23 Encounter for immunization: Secondary | ICD-10-CM | POA: Diagnosis not present

## 2023-07-18 DIAGNOSIS — R110 Nausea: Secondary | ICD-10-CM | POA: Diagnosis not present

## 2023-07-18 DIAGNOSIS — G629 Polyneuropathy, unspecified: Secondary | ICD-10-CM | POA: Diagnosis not present

## 2023-07-18 DIAGNOSIS — I10 Essential (primary) hypertension: Secondary | ICD-10-CM

## 2023-07-18 DIAGNOSIS — F17210 Nicotine dependence, cigarettes, uncomplicated: Secondary | ICD-10-CM

## 2023-07-18 MED ORDER — PROMETHAZINE HCL 25 MG PO TABS: ORAL_TABLET | ORAL | 3 refills | Status: DC

## 2023-07-18 MED ORDER — ATORVASTATIN CALCIUM 20 MG PO TABS: 20.0000 mg | ORAL_TABLET | Freq: Every day | ORAL | 2 refills | Status: DC

## 2023-07-18 NOTE — Assessment & Plan Note (Signed)
Continue to counsel smoking cessation. Updated lung cancer screening ordered.

## 2023-07-18 NOTE — Assessment & Plan Note (Signed)
Continue vistaril prn.

## 2023-07-18 NOTE — Assessment & Plan Note (Signed)
Promethazine renewed.

## 2023-07-18 NOTE — Assessment & Plan Note (Signed)
Blood pressure mains well-controlled.  She will continue current medications for management of hypertension.

## 2023-07-18 NOTE — Progress Notes (Signed)
Deborah Zhang - 53 y.o. female MRN [id number]  Date of birth: Feb 02, 1971  Subjective Chief Complaint  Patient presents with   Medical Management of Chronic Issues    HPI Deborah Zhang is a 53 y.o. female here today for follow up visit.   She reports that she is doing pretty well  She continues on lisinopril, hydrochlorothiazide and toprol for management of HTN.  BP is well controlled.  She has not experienced any significant side effects. She denies chest pain, shortness of breath, palpitations, headache or vision changes.   Anxiety stable with vistaril as needed.   Neuropathy stable with gabapentin.   Due for updated lung cancer screening  ROS:  A comprehensive ROS was completed and negative except as noted per HPI   No Known Allergies  Past Medical History:  Diagnosis Date   Ankle pain 04/24/2011   Anxiety 08/04/2015   Benzodiazepine dependence (HCC) 12/02/2015   History of withdrawal    Chronic nausea 08/04/2015   Essential hypertension 12/17/2018   Generalized anxiety disorder 12/02/2015   Initiate exercise therapy while we taper down on benzodiazepines. Urgent referral to psychiatry.     History of colonic polyps 07/08/2016   Pathology benign 06/2016, f/u colonoscopy 5 years, normal random colon biopsies w/ no colitis   Muscle pain 12/02/2015   Consider changing dose of gabapentin and could increase amitriptyline, we'll discuss this further at next visit    Neuropathy 08/04/2015    Past Surgical History:  Procedure Laterality Date   ANKLE SURGERY     BREAST EXCISIONAL BIOPSY Left 10/2019   x2   BREAST LUMPECTOMY WITH RADIOACTIVE SEED LOCALIZATION Left 01/07/2020   Procedure: LEFT BREAST LUMPECTOMY X 2 WITH RADIOACTIVE SEED LOCALIZATION;  Surgeon: Abigail Miyamoto, MD;  Location: [facility];  Service: General;  Laterality: Left;   FOOT SURGERY     GALLBLADDER SURGERY     LEG SURGERY     TOTAL ABDOMINAL HYSTERECTOMY      Social History    Socioeconomic History   Marital status: Single    Spouse name: Oretta Berkland   Number of children: 2   Years of education: GED   Highest education level: Not on file  Occupational History   Occupation: homemaker  Tobacco Use   Smoking status: Every Day    Current packs/day: 1.00    Average packs/day: 1 pack/day for 20.0 years (20.0 ttl pk-yrs)    Types: Cigarettes   Smokeless tobacco: Never  Substance and Sexual Activity   Alcohol use: Yes    Alcohol/week: 1.0 standard drink of alcohol    Types: 1 Standard drinks or equivalent per week   Drug use: No   Sexual activity: Yes    Partners: Male  Other Topics Concern   Not on file  Social History Narrative   Not on file   Social Drivers of Health   Financial Resource Strain: Not on file  Food Insecurity: Not on file  Transportation Needs: Not on file  Physical Activity: Not on file  Stress: Not on file  Social Connections: Not on file    Family History  Problem Relation Age of Onset   Depression Father    Depression Mother    Hyperlipidemia Mother    Hypertension Mother     Health Maintenance  Topic Date Due   MAMMOGRAM  05/16/2022   Zoster Vaccines- Shingrix (2 of 2) 09/11/2022   INFLUENZA VACCINE  01/17/2023   Lung Cancer Screening  07/25/2023  Hepatitis C Screening  01/15/2024 (Originally 01/01/1990)   HIV Screening  01/15/2024 (Originally 01/02/1987)   Colonoscopy  08/08/2026   DTaP/Tdap/Td (2 - Td or Tdap) 11/12/2028   Pneumococcal Vaccine 78-30 Years old  Completed   HPV VACCINES  Aged Out   COVID-19 Vaccine  Discontinued     ----------------------------------------------------------------------------------------------------------------------------------------------------------------------------------------------------------------- Physical Exam BP 119/77 (BP Location: Left Arm, Patient Position: Sitting, Cuff Size: Normal)   Pulse 70   Ht 5\' 7"  (1.702 m)   Wt 168 lb (76.2 kg)   SpO2 99%   BMI  26.31 kg/m   Physical Exam Constitutional:      Appearance: Normal appearance.  Eyes:     General: No scleral icterus. Cardiovascular:     Rate and Rhythm: Normal rate and regular rhythm.  Pulmonary:     Effort: Pulmonary effort is normal.     Breath sounds: Normal breath sounds.  Musculoskeletal:     Cervical back: Neck supple.  Neurological:     Mental Status: She is alert.  Psychiatric:        Mood and Affect: Mood normal.        Behavior: Behavior normal.     ------------------------------------------------------------------------------------------------------------------------------------------------------------------------------------------------------------------- Assessment and Plan  Chronic nausea Promethazine renewed.    Smoking greater than 20 pack years Continue to counsel smoking cessation. Updated lung cancer screening ordered.   Neuropathy (HCC) Gabapentin is working well for her.  She will continue this at current strength.  Essential hypertension Blood pressure mains well-controlled.  She will continue current medications for management of hypertension.  Anxiety Continue vistaril prn.    Meds ordered this encounter  Medications   atorvastatin (LIPITOR) 20 MG tablet    Sig: Take 1 tablet (20 mg total) by mouth daily.    Dispense:  90 tablet    Refill:  2   promethazine (PHENERGAN) 25 MG tablet    Sig: TAKE 1 TABLET BY MOUTH 2 TIMES DAILY AS NEEDED FOR NAUSEA.    Dispense:  60 tablet    Refill:  3    No follow-ups on file.    This visit occurred during the SARS-CoV-2 public health emergency.  Safety protocols were in place, including screening questions prior to the visit, additional usage of staff PPE, and extensive cleaning of exam room while observing appropriate contact time as indicated for disinfecting solutions.

## 2023-07-18 NOTE — Assessment & Plan Note (Signed)
Gabapentin is working well for her.  She will continue this at current strength.

## 2023-08-14 ENCOUNTER — Telehealth: Payer: Self-pay

## 2023-08-14 MED ORDER — GABAPENTIN 300 MG PO CAPS: ORAL_CAPSULE | ORAL | 0 refills | Status: DC

## 2023-08-14 NOTE — Telephone Encounter (Signed)
 Rx has been changed and sent to the pharmacy

## 2023-08-14 NOTE — Telephone Encounter (Signed)
 Copied from CRM [id number]. Topic: Clinical - Prescription Issue >> Aug 14, 2023  3:06 PM Martha Clan wrote: Reason for CRM: gabapentin (NEURONTIN) 300 MG capsule [[id number]] Patient prescription was set to 180 pills but insurance will only cover the normal 150.  Patient needs the prescription changed back to the original amount.

## 2023-08-17 ENCOUNTER — Other Ambulatory Visit: Payer: Self-pay | Admitting: Family Medicine

## 2023-09-19 ENCOUNTER — Other Ambulatory Visit: Payer: Self-pay | Admitting: Family Medicine

## 2023-09-19 DIAGNOSIS — R110 Nausea: Secondary | ICD-10-CM

## 2023-09-20 ENCOUNTER — Other Ambulatory Visit: Payer: Self-pay | Admitting: Family Medicine

## 2023-09-20 DIAGNOSIS — F419 Anxiety disorder, unspecified: Secondary | ICD-10-CM

## 2023-10-19 ENCOUNTER — Other Ambulatory Visit: Payer: Self-pay | Admitting: Family Medicine

## 2023-10-24 ENCOUNTER — Other Ambulatory Visit: Payer: Self-pay | Admitting: Family Medicine

## 2023-10-25 ENCOUNTER — Other Ambulatory Visit: Payer: Self-pay

## 2023-10-25 ENCOUNTER — Telehealth: Payer: Self-pay | Admitting: Family Medicine

## 2023-10-25 MED ORDER — GABAPENTIN 300 MG PO CAPS: ORAL_CAPSULE | ORAL | 1 refills | Status: DC

## 2023-10-25 NOTE — Telephone Encounter (Signed)
 Copied from CRM [id number]. Topic: Clinical - Prescription Issue >> Oct 25, 2023  2:36 PM Kevelyn M wrote: Reason for CRM: Patient has no gabapentin  (NEURONTIN ) 300 MG capsule left. The pharmacy is saying that this med is being denying by Dr. Denson Flake it's too early. The pharmacy is saying it's not to early per the patient. Patient is experiencing pain. Patient has been contacting pharmacy for about a week regarding a refill on this med.  10/25/23 2:50PM- Call rec'd from patient asking about order status of Gabapentin . States that the prescription refill request kept getting denied by PCP. Per patient she's been advised that it was denied because the request was "too early" because 180 capsules were dispensed. Per patient her insurance does not cover 180 capsules, only 150 so she has not been getting the full amount and is now out. Call made to CAL for resolution, new order sent to pharmacy with 1 refill ordered as well. Patient made aware and also requested that the order be modified to reflect the correct amount to be dispensed.

## 2023-10-25 NOTE — Telephone Encounter (Signed)
Prescription sent to the pharmacy today.  

## 2023-12-19 ENCOUNTER — Other Ambulatory Visit: Payer: Self-pay | Admitting: Family Medicine

## 2024-01-15 ENCOUNTER — Other Ambulatory Visit: Payer: Self-pay | Admitting: Family Medicine

## 2024-01-15 DIAGNOSIS — I10 Essential (primary) hypertension: Secondary | ICD-10-CM

## 2024-01-16 ENCOUNTER — Ambulatory Visit: Payer: BC Managed Care – PPO | Admitting: Family Medicine

## 2024-01-19 ENCOUNTER — Other Ambulatory Visit: Payer: Self-pay | Admitting: Family Medicine

## 2024-01-19 DIAGNOSIS — R110 Nausea: Secondary | ICD-10-CM

## 2024-02-01 ENCOUNTER — Other Ambulatory Visit: Payer: Self-pay | Admitting: Family Medicine

## 2024-02-01 DIAGNOSIS — F419 Anxiety disorder, unspecified: Secondary | ICD-10-CM

## 2024-02-04 ENCOUNTER — Ambulatory Visit

## 2024-02-04 ENCOUNTER — Ambulatory Visit: Admitting: Family Medicine

## 2024-02-04 ENCOUNTER — Encounter: Payer: Self-pay | Admitting: Family Medicine

## 2024-02-04 ENCOUNTER — Other Ambulatory Visit: Payer: Self-pay | Admitting: Family Medicine

## 2024-02-04 VITALS — BP 128/85 | HR 76 | Ht 67.0 in | Wt 165.0 lb

## 2024-02-04 DIAGNOSIS — F419 Anxiety disorder, unspecified: Secondary | ICD-10-CM

## 2024-02-04 DIAGNOSIS — I10 Essential (primary) hypertension: Secondary | ICD-10-CM

## 2024-02-04 DIAGNOSIS — F17210 Nicotine dependence, cigarettes, uncomplicated: Secondary | ICD-10-CM | POA: Diagnosis not present

## 2024-02-04 DIAGNOSIS — E785 Hyperlipidemia, unspecified: Secondary | ICD-10-CM | POA: Diagnosis not present

## 2024-02-04 DIAGNOSIS — G629 Polyneuropathy, unspecified: Secondary | ICD-10-CM

## 2024-02-04 NOTE — Progress Notes (Signed)
 Deborah Zhang - 53 y.o. female MRN [id number]  Date of birth: 10-12-1970  Subjective Chief Complaint  Patient presents with   Hypertension    HPI Deborah Zhang is a 53 y.o. female here today for follow up visit.   She reports that she is doing well overall.    She remains on hydrochlorothiazide , lisinopril  and metoprolol .  BP is well controlled with current medications.  She denies chest pain, shortness of breath, palpitations, headache or vision changes.  She has not had symptosm of hypotension.   Tolerating atorvastatin  well for HLD.   She has hx of neuropathy that is well managed with gabapentin .  No side effects with this.   ROS:  A comprehensive ROS was completed and negative except as noted per HPI    No Known Allergies  Past Medical History:  Diagnosis Date   Ankle pain 04/24/2011   Anxiety 08/04/2015   Benzodiazepine dependence (HCC) 12/02/2015   History of withdrawal    Chronic nausea 08/04/2015   Essential hypertension 12/17/2018   Generalized anxiety disorder 12/02/2015   Initiate exercise therapy while we taper down on benzodiazepines. Urgent referral to psychiatry.     History of colonic polyps 07/08/2016   Pathology benign 06/2016, f/u colonoscopy 5 years, normal random colon biopsies w/ no colitis   Muscle pain 12/02/2015   Consider changing dose of gabapentin  and could increase amitriptyline , we'll discuss this further at next visit    Neuropathy 08/04/2015    Past Surgical History:  Procedure Laterality Date   ANKLE SURGERY     BREAST EXCISIONAL BIOPSY Left 10/2019   x2   BREAST LUMPECTOMY WITH RADIOACTIVE SEED LOCALIZATION Left 01/07/2020   Procedure: LEFT BREAST LUMPECTOMY X 2 WITH RADIOACTIVE SEED LOCALIZATION;  Surgeon: Vernetta Berg, MD;  Location: [facility];  Service: General;  Laterality: Left;   FOOT SURGERY     GALLBLADDER SURGERY     LEG SURGERY     TOTAL ABDOMINAL HYSTERECTOMY      Social History   Socioeconomic History    Marital status: Single    Spouse name: Alis Sawchuk   Number of children: 2   Years of education: GED   Highest education level: Not on file  Occupational History   Occupation: homemaker  Tobacco Use   Smoking status: Every Day    Current packs/day: 1.00    Average packs/day: 1 pack/day for 20.0 years (20.0 ttl pk-yrs)    Types: Cigarettes   Smokeless tobacco: Never  Substance and Sexual Activity   Alcohol use: Yes    Alcohol/week: 1.0 standard drink of alcohol    Types: 1 Standard drinks or equivalent per week   Drug use: No   Sexual activity: Yes    Partners: Male  Other Topics Concern   Not on file  Social History Narrative   Not on file   Social Drivers of Health   Financial Resource Strain: Not on file  Food Insecurity: Not on file  Transportation Needs: Not on file  Physical Activity: Not on file  Stress: Not on file  Social Connections: Not on file    Family History  Problem Relation Age of Onset   Depression Father    Depression Mother    Hyperlipidemia Mother    Hypertension Mother     Health Maintenance  Topic Date Due   HIV Screening  Never done   Hepatitis C Screening  Never done   Hepatitis B Vaccines 19-59 Average Risk (1 of  3 - 19+ 3-dose series) Never done   MAMMOGRAM  05/16/2022   Lung Cancer Screening  07/25/2023   INFLUENZA VACCINE  03/21/2024 (Originally 01/17/2024)   Colonoscopy  08/08/2026   DTaP/Tdap/Td (2 - Td or Tdap) 11/12/2028   Pneumococcal Vaccine: 50+ Years  Completed   Zoster Vaccines- Shingrix   Completed   HPV VACCINES  Aged Out   Meningococcal B Vaccine  Aged Out   Pneumococcal Vaccine  Discontinued   COVID-19 Vaccine  Discontinued     ----------------------------------------------------------------------------------------------------------------------------------------------------------------------------------------------------------------- Physical Exam BP 128/85 (BP Location: Left Arm, Patient Position: Sitting, Cuff  Size: Normal)   Pulse 76   Ht 5' 7 (1.702 m)   Wt 165 lb (74.8 kg)   SpO2 100%   BMI 25.84 kg/m   Physical Exam Constitutional:      Appearance: Normal appearance.  Cardiovascular:     Rate and Rhythm: Normal rate and regular rhythm.  Pulmonary:     Effort: Pulmonary effort is normal.     Breath sounds: Normal breath sounds.  Neurological:     General: No focal deficit present.     Mental Status: She is alert.  Psychiatric:        Mood and Affect: Mood normal.     ------------------------------------------------------------------------------------------------------------------------------------------------------------------------------------------------------------------- Assessment and Plan  Essential hypertension Blood pressure mains well-controlled.  She will continue current medications for management of hypertension.  Anxiety Continue vistaril  prn.   Neuropathy (HCC) Gabapentin  is working well for her.  She will continue this at current strength.  Hyperlipidemia Update lipids.  Continue atorvastatin  at current strength.    No orders of the defined types were placed in this encounter.   Return in about 6 months (around 08/06/2024) for Hypertension.

## 2024-02-04 NOTE — Assessment & Plan Note (Signed)
 Continue vistaril prn.

## 2024-02-04 NOTE — Assessment & Plan Note (Signed)
 Blood pressure mains well-controlled.  She will continue current medications for management of hypertension.

## 2024-02-04 NOTE — Assessment & Plan Note (Signed)
 Update lipids.  Continue atorvastatin  at current strength.

## 2024-02-04 NOTE — Assessment & Plan Note (Signed)
 Gabapentin is working well for her.  She will continue this at current strength.

## 2024-02-05 LAB — CBC WITH DIFFERENTIAL/PLATELET
Basophils Absolute: 0.1 x10E3/uL (ref 0.0–0.2)
Basos: 0 %
EOS (ABSOLUTE): 0.2 x10E3/uL (ref 0.0–0.4)
Eos: 1 %
Hematocrit: 41.2 % (ref 34.0–46.6)
Hemoglobin: 14.1 g/dL (ref 11.1–15.9)
Immature Grans (Abs): 0 x10E3/uL (ref 0.0–0.1)
Immature Granulocytes: 0 %
Lymphocytes Absolute: 2.4 x10E3/uL (ref 0.7–3.1)
Lymphs: 17 %
MCH: 34.1 pg — ABNORMAL HIGH (ref 26.6–33.0)
MCHC: 34.2 g/dL (ref 31.5–35.7)
MCV: 100 fL — ABNORMAL HIGH (ref 79–97)
Monocytes Absolute: 1.4 x10E3/uL — ABNORMAL HIGH (ref 0.1–0.9)
Monocytes: 10 %
Neutrophils Absolute: 9.7 x10E3/uL — ABNORMAL HIGH (ref 1.4–7.0)
Neutrophils: 72 %
Platelets: 319 x10E3/uL (ref 150–450)
RBC: 4.13 x10E6/uL (ref 3.77–5.28)
RDW: 11 % — ABNORMAL LOW (ref 11.7–15.4)
WBC: 13.8 x10E3/uL — ABNORMAL HIGH (ref 3.4–10.8)

## 2024-02-05 LAB — CMP14+EGFR
ALT: 16 IU/L (ref 0–32)
AST: 20 IU/L (ref 0–40)
Albumin: 4.1 g/dL (ref 3.8–4.9)
Alkaline Phosphatase: 81 IU/L (ref 44–121)
BUN/Creatinine Ratio: 13 (ref 9–23)
BUN: 9 mg/dL (ref 6–24)
Bilirubin Total: 0.5 mg/dL (ref 0.0–1.2)
CO2: 23 mmol/L (ref 20–29)
Calcium: 9.5 mg/dL (ref 8.7–10.2)
Chloride: 100 mmol/L (ref 96–106)
Creatinine, Ser: 0.69 mg/dL (ref 0.57–1.00)
Globulin, Total: 2.2 g/dL (ref 1.5–4.5)
Glucose: 95 mg/dL (ref 70–99)
Potassium: 4.5 mmol/L (ref 3.5–5.2)
Sodium: 136 mmol/L (ref 134–144)
Total Protein: 6.3 g/dL (ref 6.0–8.5)
eGFR: 104 mL/min/1.73 (ref >59)

## 2024-02-05 LAB — LIPID PANEL WITH LDL/HDL RATIO
Cholesterol, Total: 145 mg/dL (ref 100–199)
HDL: 61 mg/dL (ref >39)
LDL Chol Calc (NIH): 70 mg/dL (ref 0–99)
LDL/HDL Ratio: 1.1 ratio (ref 0.0–3.2)
Triglycerides: 69 mg/dL (ref 0–149)
VLDL Cholesterol Cal: 14 mg/dL (ref 5–40)

## 2024-02-14 ENCOUNTER — Other Ambulatory Visit: Payer: Self-pay | Admitting: Family Medicine

## 2024-02-21 ENCOUNTER — Ambulatory Visit: Payer: Self-pay | Admitting: Family Medicine

## 2024-02-21 NOTE — Telephone Encounter (Signed)
 Olinda states you can call back on Monday after 2 pm.

## 2024-02-24 ENCOUNTER — Other Ambulatory Visit: Payer: Self-pay | Admitting: Family Medicine

## 2024-02-24 DIAGNOSIS — J984 Other disorders of lung: Secondary | ICD-10-CM

## 2024-03-12 ENCOUNTER — Other Ambulatory Visit: Payer: Self-pay | Admitting: Family Medicine

## 2024-03-12 DIAGNOSIS — I10 Essential (primary) hypertension: Secondary | ICD-10-CM

## 2024-03-13 ENCOUNTER — Ambulatory Visit (HOSPITAL_COMMUNITY)

## 2024-04-02 ENCOUNTER — Other Ambulatory Visit: Payer: Self-pay | Admitting: Family Medicine

## 2024-04-02 DIAGNOSIS — F419 Anxiety disorder, unspecified: Secondary | ICD-10-CM

## 2024-04-09 ENCOUNTER — Other Ambulatory Visit: Payer: Self-pay | Admitting: Family Medicine

## 2024-04-13 ENCOUNTER — Other Ambulatory Visit: Payer: Self-pay | Admitting: Family Medicine

## 2024-04-13 DIAGNOSIS — I10 Essential (primary) hypertension: Secondary | ICD-10-CM

## 2024-04-14 ENCOUNTER — Other Ambulatory Visit: Payer: Self-pay | Admitting: Family Medicine

## 2024-05-17 ENCOUNTER — Other Ambulatory Visit: Payer: Self-pay | Admitting: Family Medicine

## 2024-05-17 DIAGNOSIS — R110 Nausea: Secondary | ICD-10-CM

## 2024-05-24 ENCOUNTER — Other Ambulatory Visit: Payer: Self-pay | Admitting: Family Medicine

## 2024-05-24 DIAGNOSIS — I10 Essential (primary) hypertension: Secondary | ICD-10-CM

## 2024-05-30 ENCOUNTER — Other Ambulatory Visit: Payer: Self-pay | Admitting: Family Medicine

## 2024-05-30 DIAGNOSIS — F419 Anxiety disorder, unspecified: Secondary | ICD-10-CM

## 2024-06-09 ENCOUNTER — Other Ambulatory Visit: Payer: Self-pay | Admitting: Family Medicine

## 2024-07-14 ENCOUNTER — Other Ambulatory Visit: Payer: Self-pay | Admitting: Family Medicine

## 2024-07-14 NOTE — Telephone Encounter (Signed)
 Requesting rx rf of Gabepentin 300mg  Last written 06/09/2024 Last OV 02/04/2024 Upcoming appt 08/07/2024  Refilled per protocol.

## 2024-08-07 ENCOUNTER — Ambulatory Visit: Admitting: Family Medicine
